# Patient Record
Sex: Female | Born: 1975 | Race: White | Hispanic: No | Marital: Married | Smoking: Never smoker
Health system: Southern US, Community
[De-identification: ages and names within clinical notes are randomized; demographics above are authoritative.]

## PROBLEM LIST (undated history)

## (undated) DIAGNOSIS — N83202 Unspecified ovarian cyst, left side: Secondary | ICD-10-CM

## (undated) DIAGNOSIS — R5383 Other fatigue: Secondary | ICD-10-CM

## (undated) DIAGNOSIS — F419 Anxiety disorder, unspecified: Secondary | ICD-10-CM

## (undated) DIAGNOSIS — Z87442 Personal history of urinary calculi: Secondary | ICD-10-CM

## (undated) DIAGNOSIS — E538 Deficiency of other specified B group vitamins: Secondary | ICD-10-CM

## (undated) DIAGNOSIS — N920 Excessive and frequent menstruation with regular cycle: Secondary | ICD-10-CM

## (undated) DIAGNOSIS — N809 Endometriosis, unspecified: Secondary | ICD-10-CM

## (undated) DIAGNOSIS — I509 Heart failure, unspecified: Secondary | ICD-10-CM

## (undated) DIAGNOSIS — N390 Urinary tract infection, site not specified: Secondary | ICD-10-CM

## (undated) DIAGNOSIS — N6019 Diffuse cystic mastopathy of unspecified breast: Secondary | ICD-10-CM

## (undated) DIAGNOSIS — F32A Depression, unspecified: Secondary | ICD-10-CM

## (undated) DIAGNOSIS — Z9889 Other specified postprocedural states: Secondary | ICD-10-CM

## (undated) DIAGNOSIS — I341 Nonrheumatic mitral (valve) prolapse: Secondary | ICD-10-CM

## (undated) DIAGNOSIS — T8859XA Other complications of anesthesia, initial encounter: Secondary | ICD-10-CM

## (undated) DIAGNOSIS — A6000 Herpesviral infection of urogenital system, unspecified: Secondary | ICD-10-CM

## (undated) HISTORY — DX: Diffuse cystic mastopathy of unspecified breast: N60.19

## (undated) HISTORY — DX: Anxiety disorder, unspecified: F41.9

## (undated) HISTORY — DX: Nonrheumatic mitral (valve) prolapse: I34.1

## (undated) HISTORY — DX: Other specified postprocedural states: Z98.890

---
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
## 1999-12-25 HISTORY — PX: MANDIBLE SURGERY: SHX707

## 2001-12-24 HISTORY — PX: TUBAL LIGATION: SHX77

## 2006-02-26 ENCOUNTER — Ambulatory Visit: Payer: Self-pay | Admitting: Unknown Physician Specialty

## 2006-09-30 ENCOUNTER — Ambulatory Visit: Payer: Self-pay | Admitting: Family Medicine

## 2007-12-25 DIAGNOSIS — G459 Transient cerebral ischemic attack, unspecified: Secondary | ICD-10-CM

## 2007-12-25 HISTORY — DX: Transient cerebral ischemic attack, unspecified: G45.9

## 2008-01-28 ENCOUNTER — Ambulatory Visit: Payer: Self-pay | Admitting: General Surgery

## 2008-08-25 ENCOUNTER — Inpatient Hospital Stay: Payer: Self-pay | Admitting: Internal Medicine

## 2008-08-27 DIAGNOSIS — Z9889 Other specified postprocedural states: Secondary | ICD-10-CM

## 2008-08-27 HISTORY — DX: Other specified postprocedural states: Z98.890

## 2008-08-27 HISTORY — PX: LEFT HEART CATH AND CORONARY ANGIOGRAPHY: CATH118249

## 2009-12-06 ENCOUNTER — Ambulatory Visit: Payer: Self-pay | Admitting: Internal Medicine

## 2010-02-10 ENCOUNTER — Ambulatory Visit: Payer: Self-pay | Admitting: Internal Medicine

## 2010-07-21 ENCOUNTER — Ambulatory Visit: Payer: Self-pay | Admitting: Internal Medicine

## 2010-12-24 HISTORY — PX: OTHER SURGICAL HISTORY: SHX169

## 2011-12-11 ENCOUNTER — Ambulatory Visit: Payer: Self-pay | Admitting: Obstetrics and Gynecology

## 2011-12-14 ENCOUNTER — Ambulatory Visit: Payer: Self-pay | Admitting: Obstetrics and Gynecology

## 2011-12-20 LAB — PATHOLOGY REPORT

## 2012-06-18 ENCOUNTER — Ambulatory Visit: Payer: Self-pay | Admitting: General Surgery

## 2013-01-16 HISTORY — PX: CARDIAC CATHETERIZATION: SHX172

## 2013-01-23 DIAGNOSIS — I341 Nonrheumatic mitral (valve) prolapse: Secondary | ICD-10-CM

## 2013-01-23 HISTORY — DX: Nonrheumatic mitral (valve) prolapse: I34.1

## 2013-06-23 DIAGNOSIS — I341 Nonrheumatic mitral (valve) prolapse: Secondary | ICD-10-CM

## 2013-06-23 HISTORY — PX: MITRAL VALVE REPAIR: SHX2039

## 2013-06-23 HISTORY — DX: Nonrheumatic mitral (valve) prolapse: I34.1

## 2013-07-09 ENCOUNTER — Ambulatory Visit: Payer: Self-pay | Admitting: General Surgery

## 2013-07-14 DIAGNOSIS — Z9889 Other specified postprocedural states: Secondary | ICD-10-CM

## 2013-07-14 HISTORY — PX: MITRAL VALVE REPAIR: SHX2039

## 2013-07-14 HISTORY — DX: Other specified postprocedural states: Z98.890

## 2013-08-20 ENCOUNTER — Encounter: Payer: Self-pay | Admitting: *Deleted

## 2013-09-07 ENCOUNTER — Ambulatory Visit: Payer: Self-pay | Admitting: General Surgery

## 2013-10-08 ENCOUNTER — Encounter: Payer: Self-pay | Admitting: *Deleted

## 2013-10-27 ENCOUNTER — Ambulatory Visit: Payer: Self-pay | Admitting: General Surgery

## 2013-11-12 ENCOUNTER — Ambulatory Visit (INDEPENDENT_AMBULATORY_CARE_PROVIDER_SITE_OTHER): Payer: BC Managed Care – PPO | Admitting: General Surgery

## 2013-11-12 ENCOUNTER — Encounter: Payer: Self-pay | Admitting: General Surgery

## 2013-11-12 VITALS — BP 120/76 | Ht 67.0 in | Wt 134.0 lb

## 2013-11-12 DIAGNOSIS — Z1239 Encounter for other screening for malignant neoplasm of breast: Secondary | ICD-10-CM

## 2013-11-12 DIAGNOSIS — N6019 Diffuse cystic mastopathy of unspecified breast: Secondary | ICD-10-CM

## 2013-11-12 NOTE — Progress Notes (Signed)
Patient ID: Virginia Rodriguez, female   DOB: 07-15-1976, 37 y.o.   MRN: [id number]  Chief Complaint  Patient presents with  . Follow-up    1 year follow up breast. No mammogram    HPI Virginia Rodriguez is a 37 y.o. female.who presents for her annual breast evaluation. The most recent mammogram was done 2013.  Patient does perform regular self breast checks and gets regular mammograms done. No new breast issues. She says the right breast still a little numb from mitral valve surgery done July 2014.    HPI  Past Medical History  Diagnosis Date  . Diffuse cystic mastopathy   . Mitral valve prolapse     Past Surgical History  Procedure Laterality Date  . Laparosopic pelvic  2012  . Tubal ligation  2003  . Mandible surgery  2001  . Mitral valve repair  July 2014    Family History  Problem Relation Age of Onset  . Breast cancer Maternal Grandmother     great grandmother    Social History History  Substance Use Topics  . Smoking status: Never Smoker   . Smokeless tobacco: Not on file  . Alcohol Use: No    Allergies  Allergen Reactions  . Macrobid [Nitrofurantoin Macrocrystal] Rash    Current Outpatient Prescriptions  Medication Sig Dispense Refill  . aspirin 81 MG tablet Take 81 mg by mouth daily.      Marland Kitchen escitalopram (LEXAPRO) 20 MG tablet Take 1 tablet by mouth daily.       No current facility-administered medications for this visit.    Review of Systems Review of Systems  Constitutional: Negative.   Respiratory: Negative.   Cardiovascular: Negative.     Blood pressure 120/76, height 5\' 7"  (1.702 m), weight 134 lb (60.782 kg), last menstrual period 10/19/2013.  Physical Exam Physical Exam  Constitutional: She is oriented to person, place, and time. She appears well-developed and well-nourished.  Eyes: Conjunctivae are normal. No scleral icterus.  Neck: Neck supple.  Cardiovascular: Normal rate, regular rhythm and normal heart sounds.   Pulmonary/Chest:  Effort normal and breath sounds normal. Right breast exhibits no inverted nipple, no mass, no nipple discharge, no skin change and no tenderness. Left breast exhibits no inverted nipple, no mass, no nipple discharge, no skin change and no tenderness.  Multiple incisions at right breast area from her recent mitral valve surgery and they are all well healed.  Lymphadenopathy:    She has no cervical adenopathy.    She has no axillary adenopathy.  Neurological: She is alert and oriented to person, place, and time.  Skin: Skin is warm and dry.    Data Reviewed none  Assessment    Stable physical exam. Prior breast cysts and lumps.    Plan    Follow up one year with a bilateral screening mammogram and office visit.        Antoino Westhoff G 11/13/2013, 1:27 PM

## 2013-11-12 NOTE — Patient Instructions (Signed)
Follow up one year with a bilateral screening mammogram and office visit Continue self breast exams. Call office for any new breast issues or concerns.

## 2013-11-13 ENCOUNTER — Encounter: Payer: Self-pay | Admitting: General Surgery

## 2014-02-17 ENCOUNTER — Ambulatory Visit: Payer: Self-pay | Admitting: Obstetrics and Gynecology

## 2014-02-17 LAB — CBC
HCT: 37.3 % (ref 35.0–47.0)
HGB: 12.5 g/dL (ref 12.0–16.0)
MCH: 29.7 pg (ref 26.0–34.0)
MCHC: 33.4 g/dL (ref 32.0–36.0)
MCV: 89 fL (ref 80–100)
PLATELETS: 149 10*3/uL — AB (ref 150–440)
RBC: 4.2 10*6/uL (ref 3.80–5.20)
RDW: 13.5 % (ref 11.5–14.5)
WBC: 4.9 10*3/uL (ref 3.6–11.0)

## 2014-02-17 LAB — BASIC METABOLIC PANEL
ANION GAP: 4 — AB (ref 7–16)
BUN: 18 mg/dL (ref 7–18)
CALCIUM: 8.5 mg/dL (ref 8.5–10.1)
Chloride: 109 mmol/L — ABNORMAL HIGH (ref 98–107)
Co2: 25 mmol/L (ref 21–32)
Creatinine: 0.76 mg/dL (ref 0.60–1.30)
EGFR (Non-African Amer.): >60
GLUCOSE: 75 mg/dL (ref 65–99)
Osmolality: 276 (ref 275–301)
Potassium: 4 mmol/L (ref 3.5–5.1)
Sodium: 138 mmol/L (ref 136–145)

## 2014-02-21 HISTORY — PX: ABDOMINAL HYSTERECTOMY: SHX81

## 2014-02-26 ENCOUNTER — Ambulatory Visit: Payer: Self-pay | Admitting: Obstetrics and Gynecology

## 2014-02-27 LAB — BASIC METABOLIC PANEL
ANION GAP: 11 (ref 7–16)
BUN: 10 mg/dL (ref 7–18)
CO2: 28 mmol/L (ref 21–32)
Calcium, Total: 8.5 mg/dL (ref 8.5–10.1)
Chloride: 108 mmol/L — ABNORMAL HIGH (ref 98–107)
Creatinine: 0.8 mg/dL (ref 0.60–1.30)
EGFR (African American): >60
EGFR (Non-African Amer.): >60
Glucose: 102 mg/dL — ABNORMAL HIGH (ref 65–99)
Osmolality: 292 (ref 275–301)
Potassium: 3.9 mmol/L (ref 3.5–5.1)
SODIUM: 147 mmol/L — AB (ref 136–145)

## 2014-02-27 LAB — HEMATOCRIT: HCT: 34.3 % — AB (ref 35.0–47.0)

## 2014-03-02 LAB — PATHOLOGY REPORT

## 2014-09-20 ENCOUNTER — Encounter: Payer: Self-pay | Admitting: General Surgery

## 2014-10-25 ENCOUNTER — Encounter: Payer: Self-pay | Admitting: General Surgery

## 2014-11-02 ENCOUNTER — Encounter: Payer: Self-pay | Admitting: General Surgery

## 2014-11-03 ENCOUNTER — Ambulatory Visit (INDEPENDENT_AMBULATORY_CARE_PROVIDER_SITE_OTHER): Payer: BC Managed Care – PPO | Admitting: General Surgery

## 2014-11-03 ENCOUNTER — Encounter: Payer: Self-pay | Admitting: General Surgery

## 2014-11-03 VITALS — BP 98/62 | HR 78 | Resp 12 | Ht 67.0 in | Wt 136.0 lb

## 2014-11-03 DIAGNOSIS — N6019 Diffuse cystic mastopathy of unspecified breast: Secondary | ICD-10-CM

## 2014-11-03 NOTE — Progress Notes (Signed)
Patient ID: Virginia Rodriguez, female   DOB: 10/03/1976, 38 y.o.   MRN: [id number]  Chief Complaint  Patient presents with  . Follow-up    mammogram    HPI Virginia Rodriguez is a 38 y.o. female who presents for a breast evaluation. The most recent mammogram was done on 10/27/14.Marland Kitchen  Patient does perform regular self breast checks and gets regular mammograms done.    HPI  Past Medical History  Diagnosis Date  . Diffuse cystic mastopathy   . Mitral valve prolapse 06/2013    Past Surgical History  Procedure Laterality Date  . Laparosopic pelvic  2012  . Tubal ligation  2003  . Mandible surgery  2001  . Mitral valve repair  July 2014  . Abdominal hysterectomy  02/2014    Family History  Problem Relation Age of Onset  . Breast cancer Maternal Grandmother     great grandmother    Social History History  Substance Use Topics  . Smoking status: Never Smoker   . Smokeless tobacco: Not on file  . Alcohol Use: No    Allergies  Allergen Reactions  . Macrobid [Nitrofurantoin Macrocrystal] Rash    Current Outpatient Prescriptions  Medication Sig Dispense Refill  . aspirin 81 MG tablet Take 81 mg by mouth daily.    Marland Kitchen FLUoxetine (PROZAC) 40 MG capsule Take 1 capsule by mouth daily.  10   No current facility-administered medications for this visit.    Review of Systems Review of Systems  Constitutional: Negative.   Respiratory: Negative.   Cardiovascular: Negative.     Blood pressure 98/62, pulse 78, resp. rate 12, height 5\' 7"  (1.702 m), weight 136 lb (61.689 kg), last menstrual period 10/19/2013.  Physical Exam Physical Exam  Constitutional: She is oriented to person, place, and time. She appears well-developed and well-nourished.  Eyes: Conjunctivae are normal. No scleral icterus.  Neck: Neck supple.  Cardiovascular: Normal rate, regular rhythm and normal heart sounds.   Pulmonary/Chest: Effort normal and breath sounds normal. Right breast exhibits no inverted nipple,  no mass, no nipple discharge, no skin change and no tenderness. Left breast exhibits no inverted nipple, no mass, no nipple discharge, no skin change and no tenderness.  Abdominal: Soft. Bowel sounds are normal.  Lymphadenopathy:    She has no cervical adenopathy.    She has no axillary adenopathy.  Neurological: She is alert and oriented to person, place, and time.  Skin: Skin is warm and dry.       Data Reviewed Mammogram reviewed and stable  Assessment    Stable exam. History of breast cysts in past     Plan    Patient will be asked to return to the office in one yearfor a breast exam. Mammogram to start in 2 yrs.        Edith Groleau G 11/04/2014, 6:17 AM

## 2014-11-03 NOTE — Patient Instructions (Addendum)
The patient has been asked to return to the office in one year .Marland Kitchen Continue self breast exams. Call office for any new breast issues or concerns.

## 2014-11-04 ENCOUNTER — Ambulatory Visit: Payer: BC Managed Care – PPO | Admitting: General Surgery

## 2014-11-04 ENCOUNTER — Encounter: Payer: Self-pay | Admitting: General Surgery

## 2015-04-16 NOTE — Op Note (Signed)
PATIENT NAME:  Virginia Rodriguez, Virginia Rodriguez MR#:  [id number] DATE OF BIRTH:  Nov 22, 1976  DATE OF PROCEDURE:  02/26/2014  PREOPERATIVE DIAGNOSES: 1.  Chronic pelvic pain.  2.  Menorrhagia.   POSTOPERATIVE DIAGNOSES: 1.  Chronic pelvic pain. 2.  Endometriosis.  3.  Menorrhagia.   PROCEDURES:  1.  Laparoscopic assisted vaginal hysterectomy. 2.  Right salpingo-oophorectomy. 3.  Left salpingectomy. 4.  Peritoneal biopsy.   SURGEON: Boykin Nearing, MD  ANESTHESIA: General endotracheal.   INDICATIONS: This is a 39 year old gravida 2, para 2, a patient with chronic pelvic pain for many years. The patient has undergone 2 laparoscopic evaluations. The patient was deemed to have presumed endometriosis by Dr. Amalia Hailey. The patient has long history of menorrhagia and clot passage.   DESCRIPTION OF PROCEDURE: After adequate general endotracheal anesthesia, the patient was placed in the dorsal supine position. Legs were placed in the [place]. The patient was given intravenous ampicillin and gentamicin for her mitral valve repair and for prophylaxis for the procedure. The patient's abdomen was prepped and draped in normal sterile fashion. Foley catheter was placed in the bladder. The cervix was grasped with a single-tooth tenaculum and the uterus was sounded with a uterine sound and of these 2 instruments were taped together to be used for uterine manipulation during the procedure. Gloves were changed. A 15 mm infraumbilical incision was made after injecting with 0.5% Marcaine. The laparoscope was advanced into the abdominal cavity under direct visualization with use of the Optiview cannula. The patient's abdomen was insufflated with carbon dioxide. The patient was placed in Trendelenburg. A second port site was placed in the left lower quadrant 3 cm medial to the left anterior iliac spine, and an 11 mm port was advanced into the abdominal cavity under direct visualization. A third port site was placed on  the right lower quadrant, again 3 cm medial to the right anterior iliac spine, and a 5 mm port was advanced. Attention was directed to the patient's right ovary, which was free. There was not significant scar tissue. In the posterior cul-de-sac, there were several Allen-Masters windows consistent with endometriosis. The right fallopian tube and ovary were grasped with the atraumatic graspers and placed on traction medially while the infundibulopelvic ligament was cauterized and transected with the Harmonic scalpel. The broad ligament was incised followed by opening the round ligament with the Harmonic scalpel. The uterus was grasped at the right cornu aspect and placed on traction to the left while uterine artery was identified and cauterized with the Kleppingers. Bladder flap was opened with the Harmonic and the right uterine artery was then transected with the Harmonic scalpel. Attention was then directed to the patient's left fallopian tube which was grasped at the fimbriated portion, and the Harmonic was used to dissect the distal portion of the fallopian tube on the left and this was removed through the left lower port site. The round ligament was then clamped and transected with the Harmonic scalpel. The broad ligament was opened and the utero-ovarian ligament was clamped and transected with the Harmonic scalpel. The left uterine artery was skeletonized and cauterized with the Kleppingers and transected with the Harmonic scalpel. The central posterior cul-de-sac Allen-Masters window was also grasped at the midportion and a portion of the peritoneum was dissected free. This will be sent to pathology for identification for presumed endometriosis. Good hemostasis was noted. Procedure was then converted to the vaginal approach. The legs were brought up. Weighted speculum was placed in the posterior vaginal  vault, and the anterior cervix was then grasped with a thyroid tenaculum after removal of the single-tooth  tenaculum and the uterine sound. The cervix was circumferentially injected with 1% lidocaine with epinephrine. A direct posterior colpotomy incision was made. Upon entry into the posterior cul-de-sac, the long bill weighted speculum was placed. Uterosacral ligaments were bilaterally clamped, transected, and suture ligated with 0 Vicryl suture and were tagged for later identification. The anterior cervix was circumferentially incised with the Bovie and the anterior cul-de-sac was entered without difficulty. A Deaver retractor was placed within to elevate the bladder anteriorly. The cardinal ligaments were bilaterally clamped, transected, and suture ligated with 0 Vicryl suture, and 1 additional clamp on the broad ligaments bilaterally allowed for fraying of the uterus and the right adnexa to come through the vaginal canal. Those pedicles were bilaterally sutured with 0 Vicryl suture. Good hemostasis was noted. The peritoneum was then closed with a 2-0 PDS suture in a pursestring fashion, and the vaginal vault was closed with a running 0 Vicryl suture. The uterosacral ligaments were plicated centrally, and the rest of the vaginal vault was closed with the 0 Vicryl suture. Good hemostasis was noted. A repeat laparoscopy demonstrated good ureteral peristalsis bilaterally. This was identified throughout the intra-abdominal portion of the laparoscopy. Good hemostasis was noted. Pressure was brought down to 6 mmHg and again good hemostasis was noted. The patient's abdomen was deflated. All instruments were removed. The infraumbilical and the left lower port site were closed with a deep fascial layer of 2-0 Vicryl suture and all port site skin was closed with interrupted 4-0 Vicryl suture, Steri-Strips applied, and a sterile dressing applied. There were no complications. Estimated blood loss 50 mL. Intraoperative fluids 1400 mL. Urine output 200 mL. The patient tolerated the procedure well and was taken to the recovery room  in good condition. ____________________________ Boykin Nearing, MD tjs:sb D: 02/26/2014 12:51:46 ET T: 02/26/2014 13:35:23 ET JOB#: [id number]  cc: Boykin Nearing, MD, <Dictator> Boykin Nearing MD ELECTRONICALLY SIGNED 03/01/2014 7:52

## 2015-11-08 ENCOUNTER — Ambulatory Visit: Payer: Self-pay | Admitting: General Surgery

## 2016-01-03 ENCOUNTER — Encounter: Payer: Self-pay | Admitting: *Deleted

## 2016-01-17 ENCOUNTER — Ambulatory Visit: Payer: Self-pay | Admitting: General Surgery

## 2016-09-03 ENCOUNTER — Encounter: Payer: Self-pay | Admitting: General Surgery

## 2016-09-05 ENCOUNTER — Encounter: Payer: Self-pay | Admitting: General Surgery

## 2016-09-10 ENCOUNTER — Other Ambulatory Visit: Payer: Self-pay | Admitting: Physician Assistant

## 2016-09-10 ENCOUNTER — Encounter: Payer: Self-pay | Admitting: *Deleted

## 2016-09-10 DIAGNOSIS — R1011 Right upper quadrant pain: Secondary | ICD-10-CM

## 2016-09-10 DIAGNOSIS — R112 Nausea with vomiting, unspecified: Secondary | ICD-10-CM

## 2016-09-11 ENCOUNTER — Ambulatory Visit
Admission: RE | Admit: 2016-09-11 | Discharge: 2016-09-11 | Disposition: A | Discharge status: Home / Self Care | Payer: Managed Care, Other (non HMO) | Source: Ambulatory Visit | Attending: Physician Assistant | Admitting: Physician Assistant

## 2016-09-11 DIAGNOSIS — R112 Nausea with vomiting, unspecified: Secondary | ICD-10-CM | POA: Diagnosis not present

## 2016-09-11 DIAGNOSIS — R1011 Right upper quadrant pain: Secondary | ICD-10-CM | POA: Insufficient documentation

## 2016-09-12 ENCOUNTER — Other Ambulatory Visit: Payer: Self-pay | Admitting: Physician Assistant

## 2016-09-12 DIAGNOSIS — R1011 Right upper quadrant pain: Secondary | ICD-10-CM

## 2016-09-12 DIAGNOSIS — R110 Nausea: Secondary | ICD-10-CM

## 2016-09-17 ENCOUNTER — Encounter: Payer: Self-pay | Admitting: General Surgery

## 2016-09-17 ENCOUNTER — Ambulatory Visit (INDEPENDENT_AMBULATORY_CARE_PROVIDER_SITE_OTHER): Payer: Managed Care, Other (non HMO) | Admitting: General Surgery

## 2016-09-17 VITALS — BP 118/78 | HR 80 | Resp 12 | Ht 67.0 in | Wt 134.0 lb

## 2016-09-17 DIAGNOSIS — G8929 Other chronic pain: Secondary | ICD-10-CM | POA: Diagnosis not present

## 2016-09-17 DIAGNOSIS — R1010 Upper abdominal pain, unspecified: Secondary | ICD-10-CM | POA: Diagnosis not present

## 2016-09-17 DIAGNOSIS — N6019 Diffuse cystic mastopathy of unspecified breast: Secondary | ICD-10-CM

## 2016-09-17 DIAGNOSIS — R1011 Right upper quadrant pain: Secondary | ICD-10-CM

## 2016-09-17 NOTE — Patient Instructions (Signed)
Return after HIDA scan

## 2016-09-17 NOTE — Progress Notes (Signed)
Patient ID: Virginia Rodriguez, female   DOB: [birth year], 40 y.o.   MRN: [id number]  Chief Complaint  Patient presents with  . Follow-up    mammogram    HPI Virginia Rodriguez is a 40 y.o. female who presents for a breast evaluation. The most recent mammogram was done on 09/03/16.  Patient does perform regular self breast checks and gets regular mammograms done.    Pt reports RUQ abdominal pain for the past month, she finally saw her PCP about this last week due to start of vomiting. An ultrasound was completed which showed no abnormalities of the gallbladder. Pt continues to state pain is worsening and now radiating to her back. HIDA scan scheduled.  I have reviewed the history of present illness with the patient.  HPI  Past Medical History:  Diagnosis Date  . Diffuse cystic mastopathy   . Mitral valve prolapse 06/2013    Past Surgical History:  Procedure Laterality Date  . ABDOMINAL HYSTERECTOMY  02/2014  . laparosopic pelvic  2012  . MANDIBLE SURGERY  2001  . MITRAL VALVE REPAIR  July 2014  . TUBAL LIGATION  2003    Family History  Problem Relation Age of Onset  . Breast cancer Maternal Grandmother     great grandmother    Social History Social History  Substance Use Topics  . Smoking status: Never Smoker  . Smokeless tobacco: Not on file  . Alcohol use No    Allergies  Allergen Reactions  . Banana Anaphylaxis  . Macrobid [Nitrofurantoin Macrocrystal] Rash    Current Outpatient Prescriptions  Medication Sig Dispense Refill  . aspirin 81 MG tablet Take 81 mg by mouth daily.    Virginia Rodriguez escitalopram (LEXAPRO) 20 MG tablet TAKE 1 TABLET (20 MG TOTAL) BY MOUTH ONCE DAILY.     No current facility-administered medications for this visit.     Review of Systems Review of Systems  Constitutional: Negative.   Respiratory: Negative.   Cardiovascular: Negative.   Gastrointestinal: Positive for abdominal pain.    Blood pressure 118/78, pulse 80, resp. rate 12, height 5\' 7"   (1.702 m), weight 134 lb (60.8 kg), last menstrual period 10/19/2013.  Physical Exam Physical Exam  Constitutional: She is oriented to person, place, and time. She appears well-developed and well-nourished.  Eyes: Conjunctivae are normal. No scleral icterus.  Neck: Neck supple.  Cardiovascular: Normal rate, regular rhythm and normal heart sounds.   Pulmonary/Chest: Effort normal and breath sounds normal. Right breast exhibits no inverted nipple, no mass, no nipple discharge, no skin change and no tenderness. Left breast exhibits no inverted nipple, no mass, no nipple discharge, no skin change and no tenderness.  Abdominal: Soft. Normal appearance and bowel sounds are normal. There is no hepatomegaly. There is tenderness in the right upper quadrant.  Lymphadenopathy:    She has no cervical adenopathy.    She has no axillary adenopathy.  Neurological: She is alert and oriented to person, place, and time.  Skin: Skin is warm and dry.       Data Reviewed Mammogram reviewed, ultrasound reviewed  Assessment    Stable breast exam. Additional complaint of RUQ abdominal pain.    Plan    Return after HIDA scan 09/18/16.   Follow up in 1 year with bilateral screening mammogram. Call for any new breast issues or concerns.  This information has been scribed by Gaspar Cola CMA.        Alanea Woolridge G 09/18/2016, 12:01 PM

## 2016-09-18 ENCOUNTER — Ambulatory Visit
Admission: RE | Admit: 2016-09-18 | Discharge: 2016-09-18 | Disposition: A | Discharge status: Home / Self Care | Payer: Managed Care, Other (non HMO) | Source: Ambulatory Visit | Attending: Physician Assistant | Admitting: Physician Assistant

## 2016-09-18 ENCOUNTER — Encounter: Payer: Self-pay | Admitting: General Surgery

## 2016-09-18 DIAGNOSIS — R110 Nausea: Secondary | ICD-10-CM | POA: Insufficient documentation

## 2016-09-18 DIAGNOSIS — R1011 Right upper quadrant pain: Secondary | ICD-10-CM | POA: Diagnosis not present

## 2016-09-18 MED ORDER — TECHNETIUM TC 99M MEBROFENIN IV KIT
5.0100 | PACK | Freq: Once | INTRAVENOUS | Status: AC | PRN
  Administered 2016-09-18: 5.01 via INTRAVENOUS

## 2016-09-19 ENCOUNTER — Ambulatory Visit: Payer: Self-pay | Admitting: General Surgery

## 2016-09-19 ENCOUNTER — Telehealth: Payer: Self-pay | Admitting: *Deleted

## 2016-09-19 NOTE — Telephone Encounter (Signed)
Patient called looking for her results on the Hida scan. She also wanted to let you know that she got a e-mail saying her insurance ends this Saturday 09/22/16

## 2016-09-28 ENCOUNTER — Ambulatory Visit: Admission: RE | Admit: 2016-09-28 | Payer: Managed Care, Other (non HMO) | Source: Ambulatory Visit

## 2016-10-01 ENCOUNTER — Ambulatory Visit: Payer: Managed Care, Other (non HMO) | Admitting: General Surgery

## 2017-02-07 ENCOUNTER — Encounter: Payer: Self-pay | Admitting: Obstetrics and Gynecology

## 2017-02-27 ENCOUNTER — Encounter: Payer: Self-pay | Admitting: Obstetrics and Gynecology

## 2017-02-27 ENCOUNTER — Ambulatory Visit (INDEPENDENT_AMBULATORY_CARE_PROVIDER_SITE_OTHER): Payer: Managed Care, Other (non HMO) | Admitting: Obstetrics and Gynecology

## 2017-02-27 VITALS — BP 112/67 | HR 73 | Ht 68.0 in | Wt 139.1 lb

## 2017-02-27 DIAGNOSIS — N3941 Urge incontinence: Secondary | ICD-10-CM

## 2017-02-27 DIAGNOSIS — Z01419 Encounter for gynecological examination (general) (routine) without abnormal findings: Secondary | ICD-10-CM | POA: Diagnosis not present

## 2017-02-27 NOTE — Progress Notes (Signed)
GYNECOLOGY ANNUAL PHYSICAL EXAM PROGRESS NOTE  Subjective:    Virginia Rodriguez is a 41 y.o. G46P2002 female who presents to establish care, and for an annual exam. The patient has no complaints today. The patient is sexually active. The patient wears seatbelts: yes. The patient participates in regular exercise: yes. Has the patient ever been transfused or tattooed?: no. The patient reports that there is not domestic violence in her life.    Gynecologic History  Menarche age: 47 Patient's last menstrual period was 10/19/2013. Contraception: status post hysterectomy History of STI's: Denies  Last Pap: 2 years ago. Results were: normal.  Denies h/o abnormal pap smears. Last mammogram: 09/2016. Results were: abnormal (BIRADS 0), with f/u right breast  (normal).  Has h/o cystic mastopathy.      Obstetric History   G2   P2   T2   P0   A0   L2    SAB0   TAB0   Ectopic0   Multiple0   Live Births2     # Outcome Date GA Lbr Len/2nd Weight Sex Delivery Anes PTL Lv  2 Term     M    LIV  1 Term     F    LIV    Obstetric Comments  1st Menstrual Cycle:  12  1st Pregnancy:  23    Past Medical History:  Diagnosis Date  . Anxiety   . Diffuse cystic mastopathy   . Mitral valve prolapse 06/2013    Past Surgical History:  Procedure Laterality Date  . ABDOMINAL HYSTERECTOMY  02/2014  . laparosopic pelvic  2012   with right oophorectomy  . MANDIBLE SURGERY  2001  . MITRAL VALVE REPAIR  July 2014  . TUBAL LIGATION  2003    Family History  Problem Relation Age of Onset  . Heart disease Mother   . Diabetes Father   . Heart disease Father   . Breast cancer Maternal Grandmother     great grandmother    Social History   Social History  . Marital status: Married    Spouse name: N/A  . Number of children: N/A  . Years of education: N/A   Occupational History  . Not on file.   Social History Main Topics  . Smoking status: Never Smoker  . Smokeless tobacco: Never Used  .  Alcohol use No  . Drug use: No  . Sexual activity: Yes    Birth control/ protection: Surgical   Other Topics Concern  . Not on file   Social History Narrative  . No narrative on file    Current Outpatient Prescriptions on File Prior to Visit  Medication Sig Dispense Refill  . aspirin 81 MG tablet Take 81 mg by mouth daily.    Marland Kitchen escitalopram (LEXAPRO) 20 MG tablet TAKE 1 TABLET (20 MG TOTAL) BY MOUTH ONCE DAILY.     No current facility-administered medications on file prior to visit.     Allergies  Allergen Reactions  . Banana Anaphylaxis  . Macrobid [Nitrofurantoin Macrocrystal] Rash      Review of Systems Constitutional: negative for chills, fatigue, fevers and sweats Eyes: negative for irritation, redness and visual disturbance Ears, nose, mouth, throat, and face: negative for hearing loss, nasal congestion, snoring and tinnitus Respiratory: negative for asthma, cough, sputum Cardiovascular: negative for chest pain, dyspnea, exertional chest pressure/discomfort, irregular heart beat, palpitations and syncope Gastrointestinal: negative for abdominal pain, change in bowel habits, nausea and vomiting Genitourinary: Positive for urinary  urgency with leakage (ongoing for ~ 1-2 years).  Negative for abnormal menstrual periods, genital lesions, sexual problems and vaginal discharge, dysuri.  Integument/breast: negative for breast lump, breast tenderness and nipple discharge Hematologic/lymphatic: negative for bleeding and easy bruising Musculoskeletal:negative for back pain and muscle weakness Neurological: negative for dizziness, headaches, vertigo and weakness Endocrine: negative for diabetic symptoms including polydipsia, polyuria and skin dryness Allergic/Immunologic: negative for hay fever and urticaria        Objective:  Blood pressure 112/67, pulse 73, height 5\' 8"  (1.727 m), weight 139 lb 1.6 oz (63.1 kg), last menstrual period 10/19/2013. Body mass index is 21.15  kg/m.  General Appearance:    Alert, cooperative, no distress, appears stated age  Head:    Normocephalic, without obvious abnormality, atraumatic  Eyes:    PERRL, conjunctiva/corneas clear, EOM's intact, both eyes  Ears:    Normal external ear canals, both ears  Nose:   Nares normal, septum midline, mucosa normal, no drainage or sinus tenderness  Throat:   Lips, mucosa, and tongue normal; teeth and gums normal  Neck:   Supple, symmetrical, trachea midline, no adenopathy; thyroid: no enlargement/tenderness/nodules; no carotid bruit or JVD  Back:     Symmetric, no curvature, ROM normal, no CVA tenderness  Lungs:     Clear to auscultation bilaterally, respirations unlabored  Chest Wall:    No tenderness or deformity   Heart:    Regular rate and rhythm, S1 and S2 normal, no murmur, rub or gallop  Breast Exam:    No tenderness, masses, or nipple abnormality  Abdomen:     Soft, non-tender, bowel sounds active all four quadrants, no masses, no organomegaly.    Genitalia:    Pelvic:external genitalia normal, vagina without lesions, discharge, or tenderness, rectovaginal septum  normal.  Cystocele Grade 1-2 present. No leakage of urine on Valsalva. Uterus and cervix surgically absent.  Right ovary surgically absent.  No adnexal masses or tenderness on left.    Rectal:    Normal external sphincter.  No hemorrhoids appreciated. Internal exam not done.   Extremities:   Extremities normal, atraumatic, no cyanosis or edema  Pulses:   2+ and symmetric all extremities  Skin:   Skin color, texture, turgor normal, no rashes or lesions  Lymph nodes:   Cervical, supraclavicular, and axillary nodes normal  Neurologic:   CNII-XII intact, normal strength, sensation and reflexes throughout   .  Labs:  Labs reviewed in [facility] [place], 08/31/2016).   Assessment:    Healthy female exam.   Urinary urgency with mild incontinence  Plan:     Blood tests: None.  Labs reviewed in [place]. Breast self exam technique reviewed and patient encouraged to perform self-exam monthly. Contraception: status post hysterectomy. Discussed healthy lifestyle modifications. Mammogram up to date.  Reports theses are usually ordered by Dr. Jamal Collin (General Surgery) due to h/o cystic mastopathy.  Pap smear no longer indicated. Patient is s/p hysterectomy with no prior ho CIN II-III.   Discussed urinary urgency with mild incontinence.  Advised on avoidance of bladder irritants (caffiene, citrus fruits/drinks, spicy foods), timed voiding, and Kegel exercises.  To return if symptoms worsen or become more bothersome to patient to discuss other options.  Follow up in 1 year.    Rubie Maid, MD [facility]

## 2017-02-27 NOTE — Patient Instructions (Addendum)
Health Maintenance, Female Adopting a healthy lifestyle and getting preventive care can go a long way to promote health and wellness. Talk with your health care provider about what schedule of regular examinations is right for you. This is a good chance for you to check in with your provider about disease prevention and staying healthy. In between checkups, there are plenty of things you can do on your own. Experts have done a lot of research about which lifestyle changes and preventive measures are most likely to keep you healthy. Ask your health care provider for more information. Weight and diet Eat a healthy diet  Be sure to include plenty of vegetables, fruits, low-fat dairy products, and lean protein.  Do not eat a lot of foods high in solid fats, added sugars, or salt.  Get regular exercise. This is one of the most important things you can do for your health.  Most adults should exercise for at least 150 minutes each week. The exercise should increase your heart rate and make you sweat (moderate-intensity exercise).  Most adults should also do strengthening exercises at least twice a week. This is in addition to the moderate-intensity exercise. Maintain a healthy weight  Body mass index (BMI) is a measurement that can be used to identify possible weight problems. It estimates body fat based on height and weight. Your health care provider can help determine your BMI and help you achieve or maintain a healthy weight.  For females 76 years of age and older:  A BMI below 18.5 is considered underweight.  A BMI of 18.5 to 24.9 is normal.  A BMI of 25 to 29.9 is considered overweight.  A BMI of 30 and above is considered obese. Watch levels of cholesterol and blood lipids  You should start having your blood tested for lipids and cholesterol at 41 years of age, then have this test every 5 years.  You may need to have your cholesterol levels checked more often if:  Your lipid or  cholesterol levels are high.  You are older than 41 years of age.  You are at high risk for heart disease. Cancer screening Lung Cancer  Lung cancer screening is recommended for adults 64-42 years old who are at high risk for lung cancer because of a history of smoking.  A yearly low-dose CT scan of the lungs is recommended for people who:  Currently smoke.  Have quit within the past 15 years.  Have at least a 30-pack-year history of smoking. A pack year is smoking an average of one pack of cigarettes a day for 1 year.  Yearly screening should continue until it has been 15 years since you quit.  Yearly screening should stop if you develop a health problem that would prevent you from having lung cancer treatment. Breast Cancer  Practice breast self-awareness. This means understanding how your breasts normally appear and feel.  It also means doing regular breast self-exams. Let your health care provider know about any changes, no matter how small.  If you are in your 20s or 30s, you should have a clinical breast exam (CBE) by a health care provider every 1-3 years as part of a regular health exam.  If you are 34 or older, have a CBE every year. Also consider having a breast X-ray (mammogram) every year.  If you have a family history of breast cancer, talk to your health care provider about genetic screening.  If you are at high risk for breast cancer, talk  to your health care provider about having an MRI and a mammogram every year.  Breast cancer gene (BRCA) assessment is recommended for women who have family members with BRCA-related cancers. BRCA-related cancers include:  Breast.  Ovarian.  Tubal.  Peritoneal cancers.  Results of the assessment will determine the need for genetic counseling and BRCA1 and BRCA2 testing. Cervical Cancer  Your health care provider may recommend that you be screened regularly for cancer of the pelvic organs (ovaries, uterus, and vagina).  This screening involves a pelvic examination, including checking for microscopic changes to the surface of your cervix (Pap test). You may be encouraged to have this screening done every 3 years, beginning at age 24.  For women ages 66-65, health care providers may recommend pelvic exams and Pap testing every 3 years, or they may recommend the Pap and pelvic exam, combined with testing for human papilloma virus (HPV), every 5 years. Some types of HPV increase your risk of cervical cancer. Testing for HPV may also be done on women of any age with unclear Pap test results.  Other health care providers may not recommend any screening for nonpregnant women who are considered low risk for pelvic cancer and who do not have symptoms. Ask your health care provider if a screening pelvic exam is right for you.  If you have had past treatment for cervical cancer or a condition that could lead to cancer, you need Pap tests and screening for cancer for at least 20 years after your treatment. If Pap tests have been discontinued, your risk factors (such as having a new sexual partner) need to be reassessed to determine if screening should resume. Some women have medical problems that increase the chance of getting cervical cancer. In these cases, your health care provider may recommend more frequent screening and Pap tests. Colorectal Cancer  This type of cancer can be detected and often prevented.  Routine colorectal cancer screening usually begins at 41 years of age and continues through 41 years of age.  Your health care provider may recommend screening at an earlier age if you have risk factors for colon cancer.  Your health care provider may also recommend using home test kits to check for hidden blood in the stool.  A small camera at the end of a tube can be used to examine your colon directly (sigmoidoscopy or colonoscopy). This is done to check for the earliest forms of colorectal cancer.  Routine  screening usually begins at age 41.  Direct examination of the colon should be repeated every 5-10 years through 41 years of age. However, you may need to be screened more often if early forms of precancerous polyps or small growths are found. Skin Cancer  Check your skin from head to toe regularly.  Tell your health care provider about any new moles or changes in moles, especially if there is a change in a mole's shape or color.  Also tell your health care provider if you have a mole that is larger than the size of a pencil eraser.  Always use sunscreen. Apply sunscreen liberally and repeatedly throughout the day.  Protect yourself by wearing long sleeves, pants, a wide-brimmed hat, and sunglasses whenever you are outside. Heart disease, diabetes, and high blood pressure  High blood pressure causes heart disease and increases the risk of stroke. High blood pressure is more likely to develop in:  People who have blood pressure in the high end of the normal range (130-139/85-89 mm Hg).  People who are overweight or obese.  People who are African American.  If you are 59-24 years of age, have your blood pressure checked every 3-5 years. If you are 34 years of age or older, have your blood pressure checked every year. You should have your blood pressure measured twice-once when you are at a hospital or clinic, and once when you are not at a hospital or clinic. Record the average of the two measurements. To check your blood pressure when you are not at a hospital or clinic, you can use:  An automated blood pressure machine at a pharmacy.  A home blood pressure monitor.  If you are between 29 years and 60 years old, ask your health care provider if you should take aspirin to prevent strokes.  Have regular diabetes screenings. This involves taking a blood sample to check your fasting blood sugar level.  If you are at a normal weight and have a low risk for diabetes, have this test once  every three years after 41 years of age.  If you are overweight and have a high risk for diabetes, consider being tested at a younger age or more often. Preventing infection Hepatitis B  If you have a higher risk for hepatitis B, you should be screened for this virus. You are considered at high risk for hepatitis B if:  You were born in a country where hepatitis B is common. Ask your health care provider which countries are considered high risk.  Your parents were born in a high-risk country, and you have not been immunized against hepatitis B (hepatitis B vaccine).  You have HIV or AIDS.  You use needles to inject street drugs.  You live with someone who has hepatitis B.  You have had sex with someone who has hepatitis B.  You get hemodialysis treatment.  You take certain medicines for conditions, including cancer, organ transplantation, and autoimmune conditions. Hepatitis C  Blood testing is recommended for:  Everyone born from [birth year] through 1965.  Anyone with known risk factors for hepatitis C. Sexually transmitted infections (STIs)  You should be screened for sexually transmitted infections (STIs) including gonorrhea and chlamydia if:  You are sexually active and are younger than 41 years of age.  You are older than 41 years of age and your health care provider tells you that you are at risk for this type of infection.  Your sexual activity has changed since you were last screened and you are at an increased risk for chlamydia or gonorrhea. Ask your health care provider if you are at risk.  If you do not have HIV, but are at risk, it may be recommended that you take a prescription medicine daily to prevent HIV infection. This is called pre-exposure prophylaxis (PrEP). You are considered at risk if:  You are sexually active and do not regularly use condoms or know the HIV status of your partner(s).  You take drugs by injection.  You are sexually active with a partner  who has HIV. Talk with your health care provider about whether you are at high risk of being infected with HIV. If you choose to begin PrEP, you should first be tested for HIV. You should then be tested every 3 months for as long as you are taking PrEP. Pregnancy  If you are premenopausal and you may become pregnant, ask your health care provider about preconception counseling.  If you may become pregnant, take 400 to 800 micrograms (mcg) of folic acid  every day.  If you want to prevent pregnancy, talk to your health care provider about birth control (contraception). Osteoporosis and menopause  Osteoporosis is a disease in which the bones lose minerals and strength with aging. This can result in serious bone fractures. Your risk for osteoporosis can be identified using a bone density scan.  If you are 4 years of age or older, or if you are at risk for osteoporosis and fractures, ask your health care provider if you should be screened.  Ask your health care provider whether you should take a calcium or vitamin D supplement to lower your risk for osteoporosis.  Menopause may have certain physical symptoms and risks.  Hormone replacement therapy may reduce some of these symptoms and risks. Talk to your health care provider about whether hormone replacement therapy is right for you. Follow these instructions at home:  Schedule regular health, dental, and eye exams.  Stay current with your immunizations.  Do not use any tobacco products including cigarettes, chewing tobacco, or electronic cigarettes.  If you are pregnant, do not drink alcohol.  If you are breastfeeding, limit how much and how often you drink alcohol.  Limit alcohol intake to no more than 1 drink per day for nonpregnant women. One drink equals 12 ounces of beer, 5 ounces of wine, or 1 ounces of hard liquor.  Do not use street drugs.  Do not share needles.  Ask your health care provider for help if you need support  or information about quitting drugs.  Tell your health care provider if you often feel depressed.  Tell your health care provider if you have ever been abused or do not feel safe at home. This information is not intended to replace advice given to you by your health care provider. Make sure you discuss any questions you have with your health care provider. Document Released: 06/25/2011 Document Revised: 05/17/2016 Document Reviewed: 09/13/2015 Elsevier Interactive Patient Education  2017 [employer].

## 2017-02-28 ENCOUNTER — Encounter: Payer: Self-pay | Admitting: *Deleted

## 2017-04-09 ENCOUNTER — Other Ambulatory Visit: Payer: Self-pay | Admitting: *Deleted

## 2017-04-09 MED ORDER — VALACYCLOVIR HCL 1 G PO TABS
1000.0000 mg | ORAL_TABLET | Freq: Two times a day (BID) | ORAL | 6 refills | Status: DC
Start: 2017-04-09 — End: 2022-07-01

## 2017-05-08 ENCOUNTER — Other Ambulatory Visit: Payer: Self-pay | Admitting: *Deleted

## 2017-05-08 MED ORDER — ADAPALENE-BENZOYL PEROXIDE 0.1-2.5 % EX GEL: 1.0000 | Freq: Every day | CUTANEOUS | 6 refills | Status: DC

## 2017-07-18 ENCOUNTER — Other Ambulatory Visit: Payer: Self-pay | Admitting: *Deleted

## 2017-07-18 MED ORDER — ESCITALOPRAM OXALATE 20 MG PO TABS: ORAL_TABLET | ORAL | 6 refills | Status: DC

## 2017-07-22 ENCOUNTER — Other Ambulatory Visit: Payer: Self-pay

## 2017-07-22 DIAGNOSIS — Z1231 Encounter for screening mammogram for malignant neoplasm of breast: Secondary | ICD-10-CM

## 2017-07-26 ENCOUNTER — Other Ambulatory Visit: Payer: Self-pay | Admitting: *Deleted

## 2017-07-26 ENCOUNTER — Inpatient Hospital Stay
Admission: RE | Admit: 2017-07-26 | Discharge: 2017-07-26 | Disposition: A | Discharge status: Home / Self Care | Payer: Self-pay | Source: Ambulatory Visit | Attending: *Deleted | Admitting: *Deleted

## 2017-07-26 DIAGNOSIS — Z9289 Personal history of other medical treatment: Secondary | ICD-10-CM

## 2017-09-04 ENCOUNTER — Other Ambulatory Visit: Payer: Self-pay | Admitting: General Surgery

## 2017-09-04 ENCOUNTER — Ambulatory Visit
Admission: RE | Admit: 2017-09-04 | Discharge: 2017-09-04 | Disposition: A | Discharge status: Home / Self Care | Payer: Managed Care, Other (non HMO) | Source: Ambulatory Visit | Attending: General Surgery | Admitting: General Surgery

## 2017-09-04 DIAGNOSIS — N6310 Unspecified lump in the right breast, unspecified quadrant: Secondary | ICD-10-CM

## 2017-09-04 DIAGNOSIS — Z1231 Encounter for screening mammogram for malignant neoplasm of breast: Secondary | ICD-10-CM

## 2017-09-12 ENCOUNTER — Ambulatory Visit
Admission: RE | Admit: 2017-09-12 | Discharge: 2017-09-12 | Disposition: A | Discharge status: Home / Self Care | Payer: Managed Care, Other (non HMO) | Source: Ambulatory Visit | Attending: General Surgery | Admitting: General Surgery

## 2017-09-12 ENCOUNTER — Ambulatory Visit: Payer: Managed Care, Other (non HMO) | Admitting: General Surgery

## 2017-09-12 DIAGNOSIS — N6311 Unspecified lump in the right breast, upper outer quadrant: Secondary | ICD-10-CM | POA: Insufficient documentation

## 2017-09-12 DIAGNOSIS — N6310 Unspecified lump in the right breast, unspecified quadrant: Secondary | ICD-10-CM

## 2017-09-12 DIAGNOSIS — N6001 Solitary cyst of right breast: Secondary | ICD-10-CM | POA: Insufficient documentation

## 2017-09-18 ENCOUNTER — Ambulatory Visit (INDEPENDENT_AMBULATORY_CARE_PROVIDER_SITE_OTHER): Payer: Managed Care, Other (non HMO) | Admitting: General Surgery

## 2017-09-18 ENCOUNTER — Encounter: Payer: Self-pay | Admitting: General Surgery

## 2017-09-18 VITALS — BP 98/68 | HR 70 | Resp 12 | Ht 69.0 in | Wt 141.0 lb

## 2017-09-18 DIAGNOSIS — D171 Benign lipomatous neoplasm of skin and subcutaneous tissue of trunk: Secondary | ICD-10-CM | POA: Diagnosis not present

## 2017-09-18 DIAGNOSIS — N6011 Diffuse cystic mastopathy of right breast: Secondary | ICD-10-CM

## 2017-09-18 NOTE — Progress Notes (Signed)
Patient ID: Virginia Rodriguez, female   DOB: 1976/02/16, 41 y.o.   MRN: [id number]  Chief Complaint  Patient presents with  . Follow-up    HPI [name] is a 41 y.o. female. who presents for a breast evaluation. The most recent mammogram was done on 09-12-17.  Patient does perform regular self breast checks and gets regular mammograms done.   She does feel like she has scar tissue right lateral breast from her heart surgery. She denies any further abdominal pain.   HPI  Past Medical History:  Diagnosis Date  . Anxiety   . Diffuse cystic mastopathy   . Mitral valve prolapse 06/2013    Past Surgical History:  Procedure Laterality Date  . ABDOMINAL HYSTERECTOMY  02/2014  . laparosopic pelvic  2012   with right oophorectomy  . MANDIBLE SURGERY  2001  . MITRAL VALVE REPAIR  July 2014  . TUBAL LIGATION  2003    Family History  Problem Relation Age of Onset  . Heart disease Mother   . Diabetes Father   . Heart disease Father   . Breast cancer Maternal Grandmother        great grandmother    Social History Social History  Substance Use Topics  . Smoking status: Never Smoker  . Smokeless tobacco: Never Used  . Alcohol use No    Allergies  Allergen Reactions  . Banana Anaphylaxis  . Macrobid [Nitrofurantoin Macrocrystal] Rash    Current Outpatient Prescriptions  Medication Sig Dispense Refill  . Adapalene-Benzoyl Peroxide 0.1-2.5 % gel Apply 1 applicator topically at bedtime. 45 g 6  . aspirin 81 MG tablet Take 81 mg by mouth daily.    Marland Kitchen escitalopram (LEXAPRO) 20 MG tablet TAKE 1 TABLET (20 MG TOTAL) BY MOUTH ONCE DAILY. 30 tablet 6  . valACYclovir (VALTREX) 1000 MG tablet Take 1 tablet (1,000 mg total) by mouth 2 (two) times daily. 60 tablet 6   No current facility-administered medications for this visit.     Review of Systems Review of Systems  Constitutional: Negative.   Respiratory: Negative.   Cardiovascular: Negative.     Blood pressure 98/68, pulse  70, resp. rate 12, height 5\' 9"  (1.753 m), weight 141 lb (64 kg), last menstrual period 10/19/2013.  Physical Exam Physical Exam  Constitutional: She is oriented to person, place, and time. She appears well-developed and well-nourished.  HENT:  Mouth/Throat: Oropharynx is clear and moist.  Eyes: Conjunctivae are normal. No scleral icterus.  Neck: Neck supple.  Cardiovascular: Normal rate, regular rhythm and normal heart sounds.   Pulmonary/Chest: Effort normal and breath sounds normal. No respiratory distress.     Right breast exhibits no inverted nipple, no mass, no nipple discharge, no skin change and no tenderness. Left breast exhibits no inverted nipple, no mass, no nipple discharge, no skin change and no tenderness.    Lymphadenopathy:    She has no cervical adenopathy.    She has no axillary adenopathy.  Neurological: She is alert and oriented to person, place, and time.  Skin: Skin is warm and dry.  Psychiatric: Her behavior is normal.    Data Reviewed Recent mammogram and prior notes reviewed.  Assessment     Right sided fibrocystic breast disease - stable mammogram and physical exam. The right lateral scar tissue from her mitral valve repair in 06/2013 is well healed without any nodules palpated around it, and patient was reassured. US of the lateral right breast in the area of the patient's  concern showed no findings.  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 Therefore, no current plans for excision.     Plan    The patient has been asked to follow up with Dr. Marcelline Mates in one year with a bilateral screening mammogram. Return to clinic if the lipoma continues to grow or patient experiences any discomfort.     HPI, Physical Exam, Assessment and Plan have been scribed under the direction and in the presence of Mckinley Jewel, MD Karie Fetch, RN  I have completed the exam and reviewed the above documentation for accuracy and completeness.  I agree with the above.   Haematologist has been used and any errors in dictation or transcription are unintentional.  Seeplaputhur G. Jamal Collin, M.D., F.A.C.S.    Junie Panning G 09/18/2017, 10:14 AM

## 2017-09-18 NOTE — Patient Instructions (Addendum)
The patient has been asked to follow up with Dr. Marcelline Mates in one year with a bilateral screening mammogram. Return to clinic if the lipoma continues to grow or patient experiences any discomfort.

## 2017-10-29 ENCOUNTER — Other Ambulatory Visit: Payer: Self-pay | Admitting: *Deleted

## 2017-10-29 MED ORDER — AMOXICILLIN 500 MG PO CAPS: 500.0000 mg | ORAL_CAPSULE | Freq: Three times a day (TID) | ORAL | 1 refills | Status: DC

## 2018-03-13 ENCOUNTER — Other Ambulatory Visit: Payer: Self-pay | Admitting: *Deleted

## 2018-03-13 MED ORDER — FLUCONAZOLE 150 MG PO TABS: 150.0000 mg | ORAL_TABLET | Freq: Once | ORAL | 3 refills | Status: AC

## 2018-03-13 MED ORDER — TERCONAZOLE 0.4 % VA CREA: 1.0000 | TOPICAL_CREAM | Freq: Every day | VAGINAL | 0 refills | Status: DC

## 2018-05-14 ENCOUNTER — Other Ambulatory Visit: Payer: Self-pay | Admitting: *Deleted

## 2018-05-14 MED ORDER — LEVOFLOXACIN 500 MG PO TABS: 500.0000 mg | ORAL_TABLET | Freq: Every day | ORAL | 0 refills | Status: DC

## 2018-06-30 ENCOUNTER — Other Ambulatory Visit: Payer: Self-pay | Admitting: *Deleted

## 2018-06-30 MED ORDER — CIPROFLOXACIN-DEXAMETHASONE 0.3-0.1 % OT SUSP: 4.0000 [drp] | Freq: Two times a day (BID) | OTIC | 2 refills | Status: DC

## 2018-08-06 ENCOUNTER — Other Ambulatory Visit: Payer: Self-pay | Admitting: Obstetrics and Gynecology

## 2018-08-06 DIAGNOSIS — Z1231 Encounter for screening mammogram for malignant neoplasm of breast: Secondary | ICD-10-CM

## 2018-09-12 ENCOUNTER — Other Ambulatory Visit: Payer: Self-pay | Admitting: Internal Medicine

## 2018-09-12 DIAGNOSIS — R05 Cough: Secondary | ICD-10-CM

## 2018-09-12 DIAGNOSIS — R053 Chronic cough: Secondary | ICD-10-CM

## 2018-09-15 ENCOUNTER — Ambulatory Visit
Admission: RE | Admit: 2018-09-15 | Discharge: 2018-09-15 | Disposition: A | Discharge status: Home / Self Care | Payer: PRIVATE HEALTH INSURANCE | Source: Ambulatory Visit | Attending: Obstetrics and Gynecology | Admitting: Obstetrics and Gynecology

## 2018-09-15 DIAGNOSIS — Z1231 Encounter for screening mammogram for malignant neoplasm of breast: Secondary | ICD-10-CM | POA: Diagnosis present

## 2018-09-16 ENCOUNTER — Ambulatory Visit
Admission: RE | Admit: 2018-09-16 | Discharge: 2018-09-16 | Disposition: A | Discharge status: Home / Self Care | Payer: PRIVATE HEALTH INSURANCE | Source: Ambulatory Visit | Attending: Internal Medicine | Admitting: Internal Medicine

## 2018-09-16 DIAGNOSIS — R918 Other nonspecific abnormal finding of lung field: Secondary | ICD-10-CM | POA: Diagnosis not present

## 2018-09-16 DIAGNOSIS — Z9889 Other specified postprocedural states: Secondary | ICD-10-CM | POA: Diagnosis not present

## 2018-09-16 DIAGNOSIS — R05 Cough: Secondary | ICD-10-CM | POA: Diagnosis not present

## 2018-09-16 DIAGNOSIS — R053 Chronic cough: Secondary | ICD-10-CM

## 2018-09-22 ENCOUNTER — Ambulatory Visit: Payer: Self-pay

## 2019-04-14 ENCOUNTER — Other Ambulatory Visit: Payer: Self-pay | Admitting: *Deleted

## 2019-04-14 MED ORDER — ESCITALOPRAM OXALATE 20 MG PO TABS: ORAL_TABLET | ORAL | 6 refills | Status: DC

## 2019-08-13 ENCOUNTER — Other Ambulatory Visit: Payer: Self-pay | Admitting: Internal Medicine

## 2019-08-13 DIAGNOSIS — Z1231 Encounter for screening mammogram for malignant neoplasm of breast: Secondary | ICD-10-CM

## 2019-09-17 ENCOUNTER — Ambulatory Visit
Admission: RE | Admit: 2019-09-17 | Discharge: 2019-09-17 | Disposition: A | Discharge status: Home / Self Care | Payer: BLUE CROSS/BLUE SHIELD | Source: Ambulatory Visit | Attending: Internal Medicine | Admitting: Internal Medicine

## 2019-09-17 DIAGNOSIS — Z1231 Encounter for screening mammogram for malignant neoplasm of breast: Secondary | ICD-10-CM | POA: Diagnosis not present

## 2019-09-21 ENCOUNTER — Other Ambulatory Visit: Payer: Self-pay | Admitting: Internal Medicine

## 2019-09-21 DIAGNOSIS — N6489 Other specified disorders of breast: Secondary | ICD-10-CM

## 2019-09-21 DIAGNOSIS — R928 Other abnormal and inconclusive findings on diagnostic imaging of breast: Secondary | ICD-10-CM

## 2019-10-01 ENCOUNTER — Ambulatory Visit
Admission: RE | Admit: 2019-10-01 | Discharge: 2019-10-01 | Disposition: A | Discharge status: Home / Self Care | Payer: PRIVATE HEALTH INSURANCE | Source: Ambulatory Visit | Attending: Internal Medicine | Admitting: Internal Medicine

## 2019-10-01 DIAGNOSIS — N6489 Other specified disorders of breast: Secondary | ICD-10-CM | POA: Insufficient documentation

## 2019-10-01 DIAGNOSIS — R928 Other abnormal and inconclusive findings on diagnostic imaging of breast: Secondary | ICD-10-CM | POA: Diagnosis not present

## 2019-10-02 ENCOUNTER — Other Ambulatory Visit: Payer: Self-pay | Admitting: Internal Medicine

## 2019-10-02 DIAGNOSIS — N6310 Unspecified lump in the right breast, unspecified quadrant: Secondary | ICD-10-CM

## 2019-10-02 DIAGNOSIS — R928 Other abnormal and inconclusive findings on diagnostic imaging of breast: Secondary | ICD-10-CM

## 2019-10-07 ENCOUNTER — Ambulatory Visit
Admission: RE | Admit: 2019-10-07 | Discharge: 2019-10-07 | Disposition: A | Discharge status: Home / Self Care | Payer: BLUE CROSS/BLUE SHIELD | Source: Ambulatory Visit | Attending: Internal Medicine | Admitting: Internal Medicine

## 2019-10-07 DIAGNOSIS — R928 Other abnormal and inconclusive findings on diagnostic imaging of breast: Secondary | ICD-10-CM

## 2019-10-07 DIAGNOSIS — N6310 Unspecified lump in the right breast, unspecified quadrant: Secondary | ICD-10-CM

## 2019-10-07 HISTORY — PX: BREAST BIOPSY: SHX20

## 2019-10-08 ENCOUNTER — Other Ambulatory Visit: Payer: Self-pay | Admitting: Internal Medicine

## 2019-10-08 DIAGNOSIS — N6489 Other specified disorders of breast: Secondary | ICD-10-CM

## 2019-10-08 DIAGNOSIS — R928 Other abnormal and inconclusive findings on diagnostic imaging of breast: Secondary | ICD-10-CM

## 2019-10-08 LAB — SURGICAL PATHOLOGY

## 2019-10-15 ENCOUNTER — Ambulatory Visit
Admission: RE | Admit: 2019-10-15 | Discharge: 2019-10-15 | Disposition: A | Discharge status: Home / Self Care | Payer: BLUE CROSS/BLUE SHIELD | Source: Ambulatory Visit | Attending: Internal Medicine | Admitting: Internal Medicine

## 2019-10-15 DIAGNOSIS — R928 Other abnormal and inconclusive findings on diagnostic imaging of breast: Secondary | ICD-10-CM

## 2019-10-15 DIAGNOSIS — N6489 Other specified disorders of breast: Secondary | ICD-10-CM

## 2019-10-15 HISTORY — PX: BREAST BIOPSY: SHX20

## 2019-10-16 LAB — SURGICAL PATHOLOGY

## 2020-05-17 ENCOUNTER — Other Ambulatory Visit: Payer: Self-pay | Admitting: Obstetrics and Gynecology

## 2020-05-17 DIAGNOSIS — Z1231 Encounter for screening mammogram for malignant neoplasm of breast: Secondary | ICD-10-CM

## 2020-11-03 ENCOUNTER — Ambulatory Visit
Admission: RE | Admit: 2020-11-03 | Discharge: 2020-11-03 | Disposition: A | Discharge status: Home / Self Care | Payer: 59 | Source: Ambulatory Visit | Attending: Obstetrics and Gynecology | Admitting: Obstetrics and Gynecology

## 2020-11-03 ENCOUNTER — Other Ambulatory Visit: Payer: Self-pay

## 2020-11-03 DIAGNOSIS — Z1231 Encounter for screening mammogram for malignant neoplasm of breast: Secondary | ICD-10-CM | POA: Diagnosis present

## 2020-11-08 ENCOUNTER — Other Ambulatory Visit (HOSPITAL_COMMUNITY): Payer: Self-pay | Admitting: Physician Assistant

## 2020-11-08 ENCOUNTER — Other Ambulatory Visit: Payer: Self-pay | Admitting: Physician Assistant

## 2020-11-08 DIAGNOSIS — M25559 Pain in unspecified hip: Secondary | ICD-10-CM

## 2020-11-08 DIAGNOSIS — M76891 Other specified enthesopathies of right lower limb, excluding foot: Secondary | ICD-10-CM

## 2020-11-20 ENCOUNTER — Other Ambulatory Visit: Payer: Self-pay

## 2020-11-20 ENCOUNTER — Ambulatory Visit
Admission: RE | Admit: 2020-11-20 | Discharge: 2020-11-20 | Disposition: A | Discharge status: Home / Self Care | Payer: 59 | Source: Ambulatory Visit | Attending: Physician Assistant | Admitting: Physician Assistant

## 2020-11-20 DIAGNOSIS — M76891 Other specified enthesopathies of right lower limb, excluding foot: Secondary | ICD-10-CM | POA: Diagnosis present

## 2020-11-20 DIAGNOSIS — M25559 Pain in unspecified hip: Secondary | ICD-10-CM | POA: Diagnosis not present

## 2021-10-16 ENCOUNTER — Other Ambulatory Visit: Payer: Self-pay | Admitting: Obstetrics and Gynecology

## 2021-10-16 DIAGNOSIS — Z1231 Encounter for screening mammogram for malignant neoplasm of breast: Secondary | ICD-10-CM

## 2022-03-19 ENCOUNTER — Other Ambulatory Visit: Payer: Self-pay | Admitting: Physician Assistant

## 2022-03-19 ENCOUNTER — Ambulatory Visit
Admission: RE | Admit: 2022-03-19 | Discharge: 2022-03-19 | Disposition: A | Discharge status: Home / Self Care | Payer: BC Managed Care – PPO | Source: Ambulatory Visit | Attending: Physician Assistant | Admitting: Physician Assistant

## 2022-03-19 DIAGNOSIS — R109 Unspecified abdominal pain: Secondary | ICD-10-CM

## 2022-03-19 DIAGNOSIS — R1031 Right lower quadrant pain: Secondary | ICD-10-CM | POA: Insufficient documentation

## 2022-07-01 ENCOUNTER — Ambulatory Visit
Admission: EM | Admit: 2022-07-01 | Discharge: 2022-07-01 | Disposition: A | Discharge status: Home / Self Care | Payer: BC Managed Care – PPO

## 2022-07-01 DIAGNOSIS — L03011 Cellulitis of right finger: Secondary | ICD-10-CM

## 2022-07-01 MED ORDER — MUPIROCIN 2 % EX OINT: 1.0000 | TOPICAL_OINTMENT | Freq: Two times a day (BID) | CUTANEOUS | 0 refills | Status: DC

## 2022-07-01 MED ORDER — CEFDINIR 300 MG PO CAPS: 300.0000 mg | ORAL_CAPSULE | Freq: Two times a day (BID) | ORAL | 0 refills | Status: AC

## 2022-07-01 NOTE — Discharge Instructions (Addendum)
-  There is a skin infection. - Warm compresses to the area. - Clean with soap and water and dry well.  Apply the mupirocin ointment. - Try to leave open to air sometimes. - I have sent antibiotics to pharmacy for you.  Take full course. - Return if you develop increased redness or swelling or red streak up your finger/hand or if you develop a fever.

## 2022-07-01 NOTE — ED Triage Notes (Signed)
Pt c/o injury to 3rd finger on right hand.   Pt stabbed her finger with a Butcher knife while washing dishes and states that it has gotten more painful.   Pt states it "feels like a burn and feels raw".  Pt states that the initial cut looked like a paper cut.

## 2022-07-01 NOTE — ED Provider Notes (Signed)
[facility]    CSN: [id number] Arrival date & time: 07/01/22  1315      History   Chief Complaint Chief Complaint  Patient presents with   Finger Injury    HPI Virginia Rodriguez is a 46 y.o. female presenting for pain of right middle finger.  Patient reports 6 days ago she was washing dishes and actually punctured her finger on a butcher knife.  She reports that she has been cleaning it with hydrogen peroxide and keeping a Band-Aid on it.  She also says she is apply Bactroban but over the past few days she developed increased redness and swelling and even reports pushing pus out of the cut.  She is afraid it is infected now.  She reports some aching into the joint of her finger as well.  She has not any numbness or tingling and denies any fevers.  HPI  Past Medical History:  Diagnosis Date   Anxiety    Diffuse cystic mastopathy    Mitral valve prolapse 06/2013    There are no problems to display for this patient.   Past Surgical History:  Procedure Laterality Date   ABDOMINAL HYSTERECTOMY  02/2014   BREAST BIOPSY Right 10/07/2019   u/s bx, heart marker, BENIGN MAMMARY PARENCHYMA WITH FIBROCYSTIC CHANGES. - NEGATIVE FOR   BREAST BIOPSY Right 10/15/2019   stereo bx, coil marker and ribbon marker deployed in same area, path pending   BREAST EXCISIONAL BIOPSY Right 1995   benign   laparosopic pelvic  2012   with right oophorectomy   MANDIBLE SURGERY  2001   MITRAL VALVE REPAIR  July 2014   TUBAL LIGATION  2003    OB History     Gravida  2   Para  2   Term  2   Preterm      AB      Living  2      SAB      IAB      Ectopic      Multiple      Live Births  2        Obstetric Comments  1st Menstrual Cycle:  12 1st Pregnancy:  23          Home Medications    Prior to Admission medications   Medication Sig Start Date End Date Taking? Authorizing Provider  aspirin 81 MG tablet Take 81 mg by mouth daily.   Yes [provider]   cefdinir (OMNICEF) 300 MG capsule Take 1 capsule (300 mg total) by mouth 2 (two) times daily for 7 days. 07/01/22 07/08/22 Yes Danton Clap, PA-C  mupirocin ointment (BACTROBAN) 2 % Apply 1 Application topically 2 (two) times daily. 07/01/22  Yes Laurene Footman B, PA-C  sertraline (ZOLOFT) 50 MG tablet Take 1 tablet by mouth daily. 12/14/20  Yes [provider]  Adapalene-Benzoyl Peroxide 0.1-2.5 % gel Apply 1 applicator topically at bedtime. 05/08/17   Shambley, Melody N, CNM  escitalopram (LEXAPRO) 20 MG tablet TAKE 1 TABLET (20 MG TOTAL) BY MOUTH ONCE DAILY. 04/14/19   Joylene Igo, CNM    Family History Family History  Problem Relation Age of Onset   Heart disease Mother    Diabetes Father    Heart disease Father    Breast cancer Maternal Grandmother        great grandmother    Social History Social History   Tobacco Use   Smoking status: Never   Smokeless tobacco:  Never  Vaping Use   Vaping Use: Never used  Substance Use Topics   Alcohol use: No   Drug use: No     Allergies   Banana and Macrobid [nitrofurantoin macrocrystal]   Review of Systems Review of Systems  Constitutional:  Negative for fever.  Musculoskeletal:  Negative for arthralgias and joint swelling.  Skin:  Positive for color change and wound.  Neurological:  Negative for weakness and numbness.     Physical Exam Triage Vital Signs ED Triage Vitals  Enc Vitals Group     BP      Pulse      Resp      Temp      Temp src      SpO2      Weight      Height      Head Circumference      Peak Flow      Pain Score      Pain Loc      Pain Edu?      Excl. in [place]?    No data found.  Updated Vital Signs BP 106/70 (BP Location: Left Arm)   Pulse 70   Temp 98.6 F (37 C) (Oral)   Resp 18   Ht '5\' 8"'$  (1.727 m)   Wt 132 lb (59.9 kg)   LMP 10/19/2013   SpO2 99%   BMI 20.07 kg/m      Physical Exam Vitals and nursing note reviewed.  Constitutional:      General: She is not in  acute distress.    Appearance: Normal appearance. She is not ill-appearing or toxic-appearing.  HENT:     Head: Normocephalic and atraumatic.  Eyes:     General: No scleral icterus.       Right eye: No discharge.        Left eye: No discharge.     Conjunctiva/sclera: Conjunctivae normal.  Cardiovascular:     Rate and Rhythm: Normal rate and regular rhythm.     Pulses: Normal pulses.  Pulmonary:     Effort: Pulmonary effort is normal. No respiratory distress.  Musculoskeletal:     Cervical back: Neck supple.     Comments: Right third finger: There is a linear laceration which is healing of the middle of the digit dorsally.  Surrounding this, there is a lot of erythema and swelling.  Area is very tender to palpation and she reports tenderness when I palpate the PIP joint.  Full range of motion.  Skin:    General: Skin is dry.  Neurological:     General: No focal deficit present.     Mental Status: She is alert. Mental status is at baseline.     Motor: No weakness.     Gait: Gait normal.  Psychiatric:        Mood and Affect: Mood normal.        Behavior: Behavior normal.        Thought Content: Thought content normal.      UC Treatments / Results  Labs (all labs ordered are listed, but only abnormal results are displayed) Labs Reviewed - No data to display  EKG   Radiology No results found.  Procedures Procedures (including critical care time)  Medications Ordered in UC Medications - No data to display  Initial Impression / Assessment and Plan / UC Course  I have reviewed the triage vital signs and the nursing notes.  Pertinent labs & imaging results that were available  during my care of the patient were reviewed by me and considered in my medical decision making (see chart for details).  46 year old female presenting for pain and swelling as well as redness around the cut of the right third finger.  She initially cut the finger 6 days ago.  On evaluation, the area  is consistent with cellulitis that appears to be mild.  Full range of motion of the joints of her finger.  We will treat at this time with cefdinir.  Also sent mupirocin ointment.  We discussed only cleaning with hydrogen peroxide initially and then after that with plain soap and water.  Advised trying to leave her Band-Aid off so the wound can breathe.  Advised returning for any worsening of symptoms.   Final Clinical Impressions(s) / UC Diagnoses   Final diagnoses:  Cellulitis of finger of right hand     Discharge Instructions      -There is a skin infection. - Warm compresses to the area. - Clean with soap and water and dry well.  Apply the mupirocin ointment. - Try to leave open to air sometimes. - I have sent antibiotics to pharmacy for you.  Take full course. - Return if you develop increased redness or swelling or red streak up your finger/hand or if you develop a fever.     ED Prescriptions     Medication Sig Dispense Auth. Provider   mupirocin ointment (BACTROBAN) 2 % Apply 1 Application topically 2 (two) times daily. 22 g Laurene Footman B, PA-C   cefdinir (OMNICEF) 300 MG capsule Take 1 capsule (300 mg total) by mouth 2 (two) times daily for 7 days. 14 capsule Danton Clap, PA-C      PDMP not reviewed this encounter.   Danton Clap, PA-C 07/01/22 1424

## 2022-09-07 HISTORY — PX: COLONOSCOPY: SHX174

## 2022-10-11 ENCOUNTER — Other Ambulatory Visit: Payer: Self-pay

## 2022-10-11 ENCOUNTER — Ambulatory Visit
Admission: EM | Admit: 2022-10-11 | Discharge: 2022-10-11 | Disposition: A | Discharge status: Home / Self Care | Payer: BC Managed Care – PPO

## 2022-10-11 DIAGNOSIS — B9789 Other viral agents as the cause of diseases classified elsewhere: Secondary | ICD-10-CM | POA: Diagnosis not present

## 2022-10-11 DIAGNOSIS — J329 Chronic sinusitis, unspecified: Secondary | ICD-10-CM

## 2022-10-11 DIAGNOSIS — R051 Acute cough: Secondary | ICD-10-CM

## 2022-10-11 DIAGNOSIS — R0981 Nasal congestion: Secondary | ICD-10-CM

## 2022-10-11 MED ORDER — PREDNISONE 20 MG PO TABS: 40.0000 mg | ORAL_TABLET | Freq: Every day | ORAL | 0 refills | Status: AC

## 2022-10-11 MED ORDER — IPRATROPIUM BROMIDE 0.06 % NA SOLN: 2.0000 | Freq: Four times a day (QID) | NASAL | 0 refills | Status: DC

## 2022-10-11 MED ORDER — PROMETHAZINE-DM 6.25-15 MG/5ML PO SYRP: 5.0000 mL | ORAL_SOLUTION | Freq: Every evening | ORAL | 0 refills | Status: DC

## 2022-10-11 NOTE — ED Triage Notes (Signed)
Pt endorses cough, nasal congestion, nose feels like its burning, teeth pain. "I think I have a sinus infection." Tried a Z pack but reports "it takes two rounds to get rid of my sinus infections.

## 2022-10-11 NOTE — ED Provider Notes (Signed)
[facility]    CSN: [id number] Arrival date & time: 10/11/22  1826      History   Chief Complaint Chief Complaint  Patient presents with   Nasal Congestion   Dental Pain    HPI Virginia Rodriguez is a 46 y.o. female presenting for 1 week history of cough, nasal congestion, sinus pressure and dental pain. Patient says she has a sinus infection. She has completed a course of azithromycin and asks for a second round.  Has a been taking benzonatate.  She reports that she contacted her PCP clinic with her symptoms and they prescribed the azithromycin but she was not seen in person.  She reports that she called back to get more medication but they wanted her to be seen in person.  Patient reports she had a temperature of 100 degrees the first day of symptoms.  She says she does not feel any better or worse than she did when symptoms began.  No report of any sore throat, chest pain, wheezing or breathing difficulty.  No report of any sick contacts.  Patient declines COVID test.  No other complaints.  HPI  Past Medical History:  Diagnosis Date   Anxiety    Diffuse cystic mastopathy    Mitral valve prolapse 06/2013    There are no problems to display for this patient.   Past Surgical History:  Procedure Laterality Date   ABDOMINAL HYSTERECTOMY  02/2014   BREAST BIOPSY Right 10/07/2019   u/s bx, heart marker, BENIGN MAMMARY PARENCHYMA WITH FIBROCYSTIC CHANGES. - NEGATIVE FOR   BREAST BIOPSY Right 10/15/2019   stereo bx, coil marker and ribbon marker deployed in same area, path pending   BREAST EXCISIONAL BIOPSY Right 1995   benign   laparosopic pelvic  2012   with right oophorectomy   MANDIBLE SURGERY  2001   MITRAL VALVE REPAIR  July 2014   TUBAL LIGATION  2003    OB History     Gravida  2   Para  2   Term  2   Preterm      AB      Living  2      SAB      IAB      Ectopic      Multiple      Live Births  2        Obstetric Comments  1st  Menstrual Cycle:  12 1st Pregnancy:  23          Home Medications    Prior to Admission medications   Medication Sig Start Date End Date Taking? Authorizing Provider  ipratropium (ATROVENT) 0.06 % nasal spray Place 2 sprays into both nostrils 4 (four) times daily. 10/11/22  Yes Danton Clap, PA-C  predniSONE (DELTASONE) 20 MG tablet Take 2 tablets (40 mg total) by mouth daily for 5 days. 10/11/22 10/16/22 Yes Danton Clap, PA-C  promethazine-dextromethorphan (PROMETHAZINE-DM) 6.25-15 MG/5ML syrup Take 5 mLs by mouth at bedtime. 10/11/22  Yes Laurene Footman B, PA-C  Adapalene-Benzoyl Peroxide 0.1-2.5 % gel Apply 1 applicator topically at bedtime. 05/08/17   Shambley, Melody N, CNM  aspirin 81 MG tablet Take 81 mg by mouth daily.    [provider]  aspirin EC 81 MG tablet Take by mouth.    [provider]  benzonatate (TESSALON) 200 MG capsule Take 200 mg by mouth 3 (three) times daily as needed. 10/05/22   [provider]  escitalopram (LEXAPRO) 20 MG  tablet TAKE 1 TABLET (20 MG TOTAL) BY MOUTH ONCE DAILY. 04/14/19   Shambley, Melody N, CNM  mupirocin ointment (BACTROBAN) 2 % Apply 1 Application topically 2 (two) times daily. 07/01/22   Laurene Footman B, PA-C  sertraline (ZOLOFT) 50 MG tablet Take 1 tablet by mouth daily. 12/14/20   [provider]    Family History Family History  Problem Relation Age of Onset   Heart disease Mother    Diabetes Father    Heart disease Father    Breast cancer Maternal Grandmother        great grandmother    Social History Social History   Tobacco Use   Smoking status: Never   Smokeless tobacco: Never  Vaping Use   Vaping Use: Never used  Substance Use Topics   Alcohol use: No   Drug use: No     Allergies   Banana and Macrobid [nitrofurantoin macrocrystal]   Review of Systems Review of Systems  Constitutional:  Positive for fatigue. Negative for chills, diaphoresis and fever.  HENT:  Positive  for congestion, rhinorrhea, sinus pressure and sinus pain. Negative for ear pain and sore throat.   Respiratory:  Positive for cough. Negative for shortness of breath.   Cardiovascular:  Negative for chest pain.  Gastrointestinal:  Negative for abdominal pain, nausea and vomiting.  Musculoskeletal:  Negative for arthralgias and myalgias.  Skin:  Negative for rash.  Neurological:  Negative for weakness and headaches.  Hematological:  Negative for adenopathy.     Physical Exam Triage Vital Signs ED Triage Vitals  Enc Vitals Group     BP      Pulse      Resp      Temp      Temp src      SpO2      Weight      Height      Head Circumference      Peak Flow      Pain Score      Pain Loc      Pain Edu?      Excl. in [place]?    No data found.  Updated Vital Signs BP (!) 104/55 (BP Location: Right Arm)   Pulse 67   Temp 98.5 F (36.9 C) (Oral)   Resp 17   Ht '5\' 8"'$  (1.727 m)   Wt 130 lb (59 kg)   LMP 10/19/2013   SpO2 100%   BMI 19.77 kg/m     Physical Exam Vitals and nursing note reviewed.  Constitutional:      General: She is not in acute distress.    Appearance: Normal appearance. She is not ill-appearing or toxic-appearing.  HENT:     Head: Normocephalic and atraumatic.     Right Ear: Tympanic membrane, ear canal and external ear normal.     Left Ear: Tympanic membrane, ear canal and external ear normal.     Nose: Congestion present.     Right Sinus: Maxillary sinus tenderness present.     Left Sinus: Maxillary sinus tenderness present.     Mouth/Throat:     Mouth: Mucous membranes are moist.     Pharynx: Oropharynx is clear.  Eyes:     General: No scleral icterus.       Right eye: No discharge.        Left eye: No discharge.     Conjunctiva/sclera: Conjunctivae normal.  Cardiovascular:     Rate and Rhythm: Normal rate and regular rhythm.  Heart sounds: Normal heart sounds.  Pulmonary:     Effort: Pulmonary effort is normal. No respiratory distress.      Breath sounds: Normal breath sounds.  Musculoskeletal:     Cervical back: Neck supple.  Skin:    General: Skin is dry.  Neurological:     General: No focal deficit present.     Mental Status: She is alert. Mental status is at baseline.     Motor: No weakness.     Gait: Gait normal.  Psychiatric:        Mood and Affect: Mood normal.        Behavior: Behavior normal.        Thought Content: Thought content normal.      UC Treatments / Results  Labs (all labs ordered are listed, but only abnormal results are displayed) Labs Reviewed - No data to display  EKG   Radiology No results found.  Procedures Procedures (including critical care time)  Medications Ordered in UC Medications - No data to display  Initial Impression / Assessment and Plan / UC Course  I have reviewed the triage vital signs and the nursing notes.  Pertinent labs & imaging results that were available during my care of the patient were reviewed by me and considered in my medical decision making (see chart for details).   46 year old female presents for nasal congestion, cough and sinus pressure for the past 1 week.  Patient has taken azithromycin course and believes she needs a second course for sinusitis.  Completed azithromycin yesterday.  Vitals are stable and she is overall well-appearing.  On exam she does have nasal congestion without drainage.  Tenderness to palpation of maxillary sinuses.  Her chest is clear auscultation heart regular rate and rhythm.  Had a discussion with patient that her symptoms are most likely due to a virus.  Advised her that she should be feeling better over the next week.  Asked if she had taken the Mucinex D as prescribed by her PCP but she says she did not receive that medication.  Advised her that she should start that one as I think will be very helpful.  I also sent Atrovent nasal spray and Promethazine DM for her to take at bedtime.  She states that her cough has kept her  awake sometimes at night.  She states that sometimes when she has a sinus infection she gets better with prednisone.  I printed a prescription for prednisone and advised her that she may take this if she is not improving in the next 3 days with the other medications but I expect that she should be.  We will hold off on more antibiotics at this time as she has only been sick for 1 week and already completed a course of antibiotics.  Advised her to follow-up with her PCP if not improving.  Advised her to return here if her symptoms are worsening.   Final Clinical Impressions(s) / UC Diagnoses   Final diagnoses:  Viral sinusitis  Acute cough  Nasal congestion     Discharge Instructions      -Symptoms are most consistent with a virus. - Start Mucinex D.  This is a medication that you need to asked the pharmacy staff for.  It is behind the counter.  You will need to show an ID. - I have sent a nasal spray for you to cough medication for you to use at bedtime.  Increase your rest and fluids. - You may  take Tylenol as needed for discomfort. - If no improvement in 3 days you can start the prednisone. - If you have a fever, worsening sinus pain, chest pain or shortness of breath, please return or follow-up with your PCP for reevaluation.  URI/COLD SYMPTOMS: Your exam today is consistent with a viral illness. Antibiotics are not indicated at this time. Use medications as directed, including cough syrup, nasal saline, and decongestants. Your symptoms should improve over the next few days and resolve within another 7-10 days. Increase rest and fluids. F/u if symptoms worsen or predominate such as sore throat, ear pain, productive cough, shortness of breath, or if you develop high fevers or worsening fatigue over the next several days.       ED Prescriptions     Medication Sig Dispense Auth. Provider   promethazine-dextromethorphan (PROMETHAZINE-DM) 6.25-15 MG/5ML syrup Take 5 mLs by mouth at  bedtime. 118 mL Laurene Footman B, PA-C   ipratropium (ATROVENT) 0.06 % nasal spray Place 2 sprays into both nostrils 4 (four) times daily. 15 mL Laurene Footman B, PA-C   predniSONE (DELTASONE) 20 MG tablet Take 2 tablets (40 mg total) by mouth daily for 5 days. 10 tablet Gretta Cool      PDMP not reviewed this encounter.   Danton Clap, PA-C 10/11/22 1905

## 2022-10-11 NOTE — Discharge Instructions (Addendum)
-  Symptoms are most consistent with a virus. - Start Mucinex D.  This is a medication that you need to asked the pharmacy staff for.  It is behind the counter.  You will need to show an ID. - I have sent a nasal spray for you to cough medication for you to use at bedtime.  Increase your rest and fluids. - You may take Tylenol as needed for discomfort. - If no improvement in 3 days you can start the prednisone. - If you have a fever, worsening sinus pain, chest pain or shortness of breath, please return or follow-up with your PCP for reevaluation.  URI/COLD SYMPTOMS: Your exam today is consistent with a viral illness. Antibiotics are not indicated at this time. Use medications as directed, including cough syrup, nasal saline, and decongestants. Your symptoms should improve over the next few days and resolve within another 7-10 days. Increase rest and fluids. F/u if symptoms worsen or predominate such as sore throat, ear pain, productive cough, shortness of breath, or if you develop high fevers or worsening fatigue over the next several days.

## 2022-12-10 ENCOUNTER — Other Ambulatory Visit: Payer: Self-pay | Admitting: Obstetrics and Gynecology

## 2022-12-10 NOTE — H&P (Signed)
Virginia Rodriguez is a 46 y.o. female here for Follow-up (U/s follow up - pelvic pain)   Referring provider: Genice Rouge*   History of Present Illness: Patient with intermittent pelvic cramping returns today for ultrasound.     Today: still having her pain, no constipation, no diarrhea, no LOA. Ibuprofen helps    TVUS Today Hysterectomy Rt ovary removed Lt ovary volume=12.52m Lt ovary contains 2 complex cysts:1)1.8cm;  septation=0.44cm  2)2.5cm No free fluid seen  CA 125 and HE4 wnl on 12/04/2022    Pertinent hx: - S/p abdominal hysterectomy in 02/2014 with right oophorectomy per pt, for fibroids - Tubal in 2014 and then hyst later    -Hx of cystic mastopathy  -Mitral valve prolapse, follows cardio   -MGM with breast cancer    Past Medical History:  has a past medical history of Anxiety, CHF (congestive heart failure) (CMS-HCC), Cystic mastopathy, Depression, Fibrocystic breast disease, Genital herpes simplex, History of uterine fibroid, Mitral valve prolapse, Rotator cuff tendinitis, left (09/27/2015), Tension headache, and UTI (urinary tract infection).  Past Surgical History:  has a past surgical history that includes Laparoscopic tubal ligation (2003); Open Reduction Mandibular Alveolar Ridge Fracture (2000); transesophageal echocardiography (N/A, 07/14/2013); Heart catheterization, negative. (08/26/2009); Right breast biopsy; Diagnostic laparoscopy (2004); Diagnostic laparoscopy (12/14/2011); Laparoscopic Assisted Vaginal Hysterectomy (02/26/2014); Cardiac surgery; and colonoscopy (N/A, 09/07/2022). Family History: family history includes Breast cancer in her maternal great-grandmother; Colon polyps in her paternal grandmother; Coronary Artery Disease (Blocked arteries around heart) in her father; Diabetes type II in her father; Heart failure in her maternal grandmother and paternal grandfather; High blood pressure (Hypertension) in her father; Mitral valve prolapse in her  mother. Social History:  reports that she has never smoked. She has never used smokeless tobacco. She reports that she does not currently use alcohol. She reports that she does not use drugs. OB/GYN History:  OB History       Gravida 2   Para 2   Term 2   Preterm     AB     Living 2       SAB     IAB     Ectopic     Molar     Multiple     Live Births 2        Allergies: is allergic to banana and macrobid [nitrofurantoin monohyd/m-cryst]. Medications: Current Outpatient Medications:    aspirin 81 MG EC tablet, Take 81 mg by mouth once daily., Disp: , Rfl:    ondansetron (ZOFRAN-ODT) 4 MG disintegrating tablet, Take 1 tablet (4 mg total) by mouth every 8 (eight) hours as needed for Nausea, Disp: 5 tablet, Rfl: 0   sertraline (ZOLOFT) 100 MG tablet, Take 1 tablet (100 mg total) by mouth once daily, Disp: 90 tablet, Rfl: 3   amoxicillin (AMOXIL) 500 MG tablet, Take 4 tablets equal to 2 g 1 hour prior to dental visits or other procedures likely to cause bacteremia. (Patient not taking: Reported on 11/02/2022), Disp: 20 tablet, Rfl: 2   benzonatate (TESSALON) 200 MG capsule, Take 1 capsule (200 mg total) by mouth 3 (three) times daily as needed for Cough (Patient not taking: Reported on 11/02/2022), Disp: 30 capsule, Rfl: 0   Review of Systems: No SOB, no palpitations or chest pain, no new lower extremity edema, no nausea or vomiting or bowel or bladder complaints. See HPI for gyn specific ROS.    Exam:   Vitals:   12/04/22 1413 BP: 109/64 Pulse: 74  Constitutional:  General appearance: Well nourished, well developed female in no acute distress.  Neuro/psych:  Normal mood and affect. No gross motor deficits. Neck:  Supple, normal appearance.  Respiratory:  Normal respiratory effort, no use of accessory muscles Skin:  No visible rashes or external lesions     Abdominal: left lower quad pain, carnett's neg, +TTP, +involuntary guarding   Pelvic:  deferred   Impression:   The primary encounter diagnosis was Complex ovarian cyst. A diagnosis of Pelvic pain in female was also pertinent to this visit.   Plan:   - LLQ pain, LO cyst  - Reviewed ultrasound results and images with pt today. I reviewed the risk of ovarian malignancy and discussed options for expectant management, surgical evaluation.  - She would like to proceed with surgical evaluation.    - Return for preop for RA Dx lap with left ovarian cystectomy and possible oophorectomy, possible lysis of adhesions and excision of endometriosis    - Warning s/s of ovarian malignancy and ovarian torsion reviewed.      Diagnoses and all orders for this visit:   Complex ovarian cyst -     Cancer Antigen (CA) 125 - [facility] -     Human Epididymis Protein 4 - [facility]

## 2022-12-11 ENCOUNTER — Encounter
Admission: RE | Admit: 2022-12-11 | Discharge: 2022-12-11 | Disposition: A | Discharge status: Home / Self Care | Payer: BC Managed Care – PPO | Source: Ambulatory Visit | Attending: Obstetrics and Gynecology | Admitting: Obstetrics and Gynecology

## 2022-12-11 VITALS — Ht 68.0 in | Wt 130.0 lb

## 2022-12-11 DIAGNOSIS — Z9889 Other specified postprocedural states: Secondary | ICD-10-CM

## 2022-12-11 DIAGNOSIS — Z01812 Encounter for preprocedural laboratory examination: Secondary | ICD-10-CM

## 2022-12-11 HISTORY — DX: Diffuse cystic mastopathy of unspecified breast: N60.19

## 2022-12-11 HISTORY — DX: Urinary tract infection, site not specified: N39.0

## 2022-12-11 HISTORY — DX: Personal history of urinary calculi: Z87.442

## 2022-12-11 HISTORY — DX: Deficiency of other specified B group vitamins: E53.8

## 2022-12-11 HISTORY — DX: Unspecified ovarian cyst, left side: N83.202

## 2022-12-11 HISTORY — DX: Endometriosis, unspecified: N80.9

## 2022-12-11 HISTORY — DX: Heart failure, unspecified: I50.9

## 2022-12-11 NOTE — Patient Instructions (Addendum)
Your procedure is scheduled on: Wednesday, December 27 Report to the Registration Desk on the 1st floor of the [facility]. To find out your arrival time, please call [phone] between 1PM - 3PM on: Tuesday, December 26 If your arrival time is 6:00 am, do not arrive prior to that time as the [facility] entrance doors do not open until 6:00 am.  REMEMBER: Instructions that are not followed completely may result in serious medical risk, up to and including death; or upon the discretion of your surgeon and anesthesiologist your surgery may need to be rescheduled.  Do not eat food after midnight the night before surgery.  No gum chewing, lozengers or hard candies.  You may however, drink CLEAR liquids up to 2 hours before you are scheduled to arrive for your surgery. Do not drink anything within 2 hours of your scheduled arrival time.  Clear liquids include: - water  - apple juice without pulp - gatorade (not RED colors) - black coffee or tea (Do NOT add milk or creamers to the coffee or tea) Do NOT drink anything that is not on this list.  TAKE THESE MEDICATIONS THE MORNING OF SURGERY WITH A SIP OF WATER:  Escitalopram (Lexapro) Sertraline (Zoloft)  One week prior to surgery: starting December 20 Stop aspirin and Anti-inflammatories (NSAIDS) such as Advil, Aleve, Ibuprofen, Motrin, Naproxen, Naprosyn and Aspirin based products such as Excedrin, Goodys Powder, BC Powder. Stop ANY OVER THE COUNTER supplements until after surgery. You may however, continue to take Tylenol if needed for pain up until the day of surgery.  No Alcohol for 24 hours before or after surgery.  No Smoking including e-cigarettes for 24 hours prior to surgery.  No chewable tobacco products for at least 6 hours prior to surgery.  No nicotine patches on the day of surgery.  Do not use any "recreational" drugs for at least a week prior to your surgery.  Please be advised that the combination of cocaine and  anesthesia may have negative outcomes, up to and including death. If you test positive for cocaine, your surgery will be cancelled.  On the morning of surgery brush your teeth with toothpaste and water, you may rinse your mouth with mouthwash if you wish. Do not swallow any toothpaste or mouthwash.  Use CHG Soap as directed on instruction sheet.  Do not wear jewelry, make-up, hairpins, clips or nail polish.  Do not wear lotions, powders, or perfumes.   Do not shave body from the neck down 48 hours prior to surgery just in case you cut yourself which could leave a site for infection.  Also, freshly shaved skin may become irritated if using the CHG soap.  Contact lenses, hearing aids and dentures may not be worn into surgery.  Do not bring valuables to the hospital. [facility] is not responsible for any missing/lost belongings or valuables.   Notify your doctor if there is any change in your medical condition (cold, fever, infection).  Wear comfortable clothing (specific to your surgery type) to the hospital.  After surgery, you can help prevent lung complications by doing breathing exercises.  Take deep breaths and cough every 1-2 hours. Your doctor may order a device called an Incentive Spirometer to help you take deep breaths. When coughing or sneezing, hold a pillow firmly against your incision with both hands. This is called "splinting." Doing this helps protect your incision. It also decreases belly discomfort.  If you are being discharged the day of surgery, you  will not be allowed to drive home. You will need a responsible adult (18 years or older) to drive you home and stay with you that night.   If you are taking public transportation, you will need to have a responsible adult (18 years or older) with you. Please confirm with your physician that it is acceptable to use public transportation.   Please call the [facility] at [phone] if you have any  questions about these instructions.  Surgery Visitation Policy:  Patients undergoing a surgery or procedure may have two family members or support persons with them as long as the person is not COVID-19 positive or experiencing its symptoms.      Preparing for Surgery with CHLORHEXIDINE GLUCONATE (CHG) Soap  Chlorhexidine Gluconate (CHG) Soap  o An antiseptic cleaner that kills germs and bonds with the skin to continue killing germs even after washing  o Used for showering the night before surgery and morning of surgery  Before surgery, you can play an important role by reducing the number of germs on your skin.  CHG (Chlorhexidine gluconate) soap is an antiseptic cleanser which kills germs and bonds with the skin to continue killing germs even after washing.  Please do not use if you have an allergy to CHG or antibacterial soaps. If your skin becomes reddened/irritated stop using the CHG.  1. Shower the NIGHT BEFORE SURGERY and the MORNING OF SURGERY with CHG soap.  2. If you choose to wash your hair, wash your hair first as usual with your normal shampoo.  3. After shampooing, rinse your hair and body thoroughly to remove the shampoo.  4. Use CHG as you would any other liquid soap. You can apply CHG directly to the skin and wash gently with a scrungie or a clean washcloth.  5. Apply the CHG soap to your body only from the neck down. Do not use on open wounds or open sores. Avoid contact with your eyes, ears, mouth, and genitals (private parts). Wash face and genitals (private parts) with your normal soap.  6. Wash thoroughly, paying special attention to the area where your surgery will be performed.  7. Thoroughly rinse your body with warm water.  8. Do not shower/wash with your normal soap after using and rinsing off the CHG soap.  9. Pat yourself dry with a clean towel.  10. Wear clean pajamas to bed the night before surgery.  12. Place clean sheets on your bed the night  of your first shower and do not sleep with pets.  13. Shower again with the CHG soap on the day of surgery prior to arriving at the hospital.  14. Do not apply any deodorants/lotions/powders.  15. Please wear clean clothes to the hospital.

## 2022-12-12 ENCOUNTER — Encounter: Payer: Self-pay | Admitting: Obstetrics and Gynecology

## 2022-12-12 NOTE — Progress Notes (Signed)
Perioperative Services  Pre-Admission/Anesthesia Testing Clinical Review  Date: 12/14/22  Patient Demographics:  Name: [name] DOB:   1975-12-28 MRN:   [id number]  Planned Surgical Procedure(s):    Case: [id number] Date/Time: 12/19/22 0715   Procedures:      XI ROBOTIC ASSISTED LAPAROSCOPIC LEFT OVARIAN CYSTECTOMY (Left)     LYSIS OF ADHESION     LAPAROSCOPIC LEFT OOPHORECTOMY WITH PELVIC WASHINGS (Left)   Anesthesia type: Choice   Pre-op diagnosis: left adnexal mass, left pelvic pain   Location: [facility] OR ROOM 07 / [facility]   Surgeons: Benjaman Kindler, MD   NOTE: Available PAT nursing documentation and vital signs have been reviewed. Clinical nursing staff has updated patient's PMH/PSHx, current medication list, and drug allergies/intolerances to ensure comprehensive history available to assist in medical decision making as it pertains to the aforementioned surgical procedure and anticipated anesthetic course. Extensive review of available clinical information performed. [place] PMH and PSHx updated with any diagnoses/procedures that  may have been inadvertently omitted during her intake with the pre-admission testing department's nursing staff.  Clinical Discussion:  Virginia Rodriguez is a 46 y.o. female who is submitted for pre-surgical anesthesia review and clearance prior to her undergoing the above procedure. Patient has never been a smoker. Pertinent PMH includes: CHF, MVP (s/p MV repair), TIA, fatigue, menorrhagia, ovarian cyst, depression.  Patient is followed by cardiology Virginia Brochure, MD). She was last seen in the cardiology clinic on 04/19/2022; notes reviewed.  At the time of her clinic visit, patient reported that she had been doing "very well over the last year".  Patient denied any episodes of chest pain, shortness breath, PND, orthopnea, palpitations, significant peripheral edema, vertiginous symptoms, or presyncope/syncope.  Patient with a past  medical history significant for cardiovascular diagnoses.  Diagnostic LEFT heart catheterization was performed on 08/27/2008 revealing a normal left ventricular systolic function with an EF of 60%.  Coronary anatomy was normal with no evidence of obstructive coronary artery disease.  Patient with a history of prolapse of the posterior leaflet of her mitral valve resulting in severe valvular regurgitation.  She ultimately underwent ring annuloplasty via a RIGHT thoracotomy approach on 07/14/2013.  Patient had a 36 mm Simulus ring placed.  Stress echocardiogram was performed on 04/13/2021 revealing a normal LVEF of >55%.  There were no wall motion abnormalities at rest or with stress.  There was an appropriate blood pressure response to exercise.  No significant echocardiographic or ECG changes during the study.  Maximum workload achieved was 11.20 METS with exercise.  There was trivial tricuspid and mild mitral regurgitation.  Study determined to be normal.  Blood pressure well-controlled at 111/70 mmHg without the use of pharmacological intervention.  Patient is not on any type of lipid-lowering therapies for ASCVD prevention.  She is not diabetic. Patient does not have an OSAH diagnosis.  Patient does not have any activity limitations. Functional capacity, as defined by DASI, is documented as being >/= 4 METS.  No changes were made to her medication regimen.  Patient to follow-up with outpatient cardiology in 1 year or sooner if needed.  Virginia Rodriguez is scheduled for an XI ROBOTIC ASSISTED LAPAROSCOPIC LEFT OVARIAN CYSTECTOMY; LYSIS OF ADHESIONS; LAPAROSCOPIC LEFT OOPHORECTOMY WITH PELVIC WASHINGS on 12/19/2022 with Dr. Benjaman Kindler, MD.  Given patient's past medical history significant for cardiovascular diagnoses, presurgical cardiac clearance was sought by the PAT team. Per cardiology, "patient is clinically stable. She is at LOW risk for cardiovascular events  surrounding her upcoming surgery.  May proceed without need for further testing".  In review of her medication reconciliation, it is noted that patient is currently on prescribed daily antiplatelet therapy. She has been instructed on recommendations from cardiology for continuing her daily low dose ASA throughout her perioperative course.   Patient reports previous perioperative complications with anesthesia in the past. Patient has a PMH (+) for PONV. Symptoms and history of PONV will be discussed with patient by anesthesia team on the day of her procedure. Interventions will be ordered as deemed necessary based on patient's individual care needs as determined by anesthesiologist.  Additionally, patient has a history of (+) delayed emergence from general anesthesia.  In review of the available records, it is noted that patient underwent a general anesthetic course at [facility] in 06/2013 without documented complications.      12/11/2022   12:51 PM 10/11/2022    6:38 PM 10/11/2022    6:35 PM  Vitals with BMI  Height '5\' 8"'$   '5\' 8"'$   Weight 130 lbs  130 lbs  BMI 38.25  05.39  Systolic  767   Diastolic  55   Pulse  67     Providers/Specialists:   NOTE: Primary physician provider listed below. Patient may have been seen by APP or partner within same practice.   PROVIDER ROLE / SPECIALTY LAST Virginia Jansky, MD OB/GYN (Surgeon) 12/04/2022  Idelle Crouch, MD Primary Care Provider 08/02/2022  Jenne Pane, MD Cardiology 04/19/2022   Allergies:  Banana and Macrobid [nitrofurantoin macrocrystal]  Current Home Medications:   No current facility-administered medications for this encounter.    amoxicillin (AMOXIL) 500 MG capsule   aspirin EC 81 MG tablet   cyanocobalamin (VITAMIN B12) 1000 MCG/ML injection   escitalopram (LEXAPRO) 20 MG tablet   sertraline (ZOLOFT) 50 MG tablet   History:   Past Medical History:  Diagnosis Date   Anxiety    B12 deficiency    CHF (congestive  heart failure), NYHA class II (HCC)    Complication of anesthesia    a.) PONV; b.) delayed emergence   Depression    Diffuse cystic mastopathy    Endometriosis    Fatigue    Fibrocystic breast disease    Frequent UTI    Genital herpes    History of cardiac catheterization 08/27/2008   a.) LHC 08/27/2008: EF 60%, no obstructive CAD   History of kidney stones    Left ovarian cyst    Menorrhagia    Mitral valve prolapse 01/23/2013   a.) stress TTE 01/23/2013: EF >55%, (+) MVP (post leaflet) with mod-sev MR; b.) TTE 06/26/2014L EF >55%, (+) MVP (post leaflet) with severe MR;  c.) Ring annuloplasty for MVR 07/14/2013 via RIGHT thoracotomy approach --> 36 mm Similus ring   Status post mitral valve repair 07/14/2013   a.) ring annuloplasty for MVR 07/14/2013 via RIGHT thoractomy approach --> 36 mm Simulus ring   TIA (transient ischemic attack) 2009   a.) presented as left sides weakness and paresthesias, left facial droop --> event self limiting with no residual effects   Past Surgical History:  Procedure Laterality Date   ABDOMINAL HYSTERECTOMY  02/2014   BREAST BIOPSY Right 10/07/2019   u/s bx, heart marker, BENIGN MAMMARY PARENCHYMA WITH FIBROCYSTIC CHANGES. - NEGATIVE FOR   BREAST BIOPSY Right 10/15/2019   stereo bx, coil marker and ribbon marker deployed in same area, path pending   BREAST EXCISIONAL BIOPSY Right 1995  benign   COLONOSCOPY  09/07/2022   laparosopic pelvic  2012   with right oophorectomy   LEFT HEART CATH AND CORONARY ANGIOGRAPHY Left 08/27/2008   Procdure: LEFT HEART CATH AND CORONARY ANGIOGRAPHY; Location: [place]; Surgeon: Katrine Coho, MD   MANDIBLE SURGERY  2001   MITRAL VALVE REPAIR  07/14/2013   Procedure: MITRAL VALVE RING ANNULOPLASTY; Location: [facility]; Surgeon: Lajuana Matte, MD   TUBAL LIGATION  2003   Family History  Problem Relation Age of Onset   Heart disease Mother    Diabetes Father    Heart disease Father    Breast cancer Maternal  Grandmother        great grandmother   Social History   Tobacco Use   Smoking status: Never   Smokeless tobacco: Never  Vaping Use   Vaping Use: Never used  Substance Use Topics   Alcohol use: No   Drug use: No    Pertinent Clinical Results:  LABS: Labs reviewed: Acceptable for surgery.  Hospital Outpatient Visit on 12/13/2022  Component Date Value Ref Range Status   WBC 12/13/2022 3.9 (L)  4.0 - 10.5 K/uL Final   RBC 12/13/2022 4.27  3.87 - 5.11 MIL/uL Final   Hemoglobin 12/13/2022 13.3  12.0 - 15.0 g/dL Final   HCT 12/13/2022 39.2  36.0 - 46.0 % Final   MCV 12/13/2022 91.8  80.0 - 100.0 fL Final   MCH 12/13/2022 31.1  26.0 - 34.0 pg Final   MCHC 12/13/2022 33.9  30.0 - 36.0 g/dL Final   RDW 12/13/2022 12.5  11.5 - 15.5 % Final   Platelets 12/13/2022 186  150 - 400 K/uL Final   nRBC 12/13/2022 0.0  0.0 - 0.2 % Final   Performed at [facility], [address] [place], [place] [address]   Sodium 12/13/2022 139  135 - 145 mmol/L Final   Potassium 12/13/2022 4.6  3.5 - 5.1 mmol/L Final   Chloride 12/13/2022 110  98 - 111 mmol/L Final   CO2 12/13/2022 22  22 - 32 mmol/L Final   Glucose, Bld 12/13/2022 95  70 - 99 mg/dL Final   Glucose reference range applies only to samples taken after fasting for at least 8 hours.   BUN 12/13/2022 22 (H)  6 - 20 mg/dL Final   Creatinine, Ser 12/13/2022 0.69  0.44 - 1.00 mg/dL Final   Calcium 12/13/2022 8.9  8.9 - 10.3 mg/dL Final   GFR, Estimated 12/13/2022 >60  >60 mL/min Final   Comment: (NOTE) Calculated using the CKD-EPI Creatinine Equation (2021)    Anion gap 12/13/2022 7  5 - 15 Final   Performed at [facility], [address] [place], [place] [address]   ABO/RH(D) 12/13/2022 A POS   Final   Antibody Screen 12/13/2022 NEG   Final   Sample Expiration 12/13/2022 12/27/2022,2359   Final   Extend sample reason 12/13/2022    Final                   Value:NO TRANSFUSIONS OR PREGNANCY IN THE PAST 3  MONTHS Performed at [facility], [address] [place], [place] [address]     ECG: Date: 12/13/2022 Time ECG obtained: 0831 AM Rate: 57 bpm Rhythm: sinus bradycardia Axis (leads I and aVF): Normal Intervals: PR 148 ms. QRS 82 ms. QTc 404 ms. ST segment and T wave changes: No evidence of acute ST segment elevation or depression Comparison: Similar to previous tracing obtained on 02/02/2021  IMAGING /  PROCEDURES: CT RENAL STONE STUDY performed on 03/19/2022 Borderline appearance for constipation Appendix poorly seen but no right lower quadrant inflammatory process is identified. Trace free pelvic fluid eccentric to the right. Simple appearing left ovarian cyst. No follow-up imaging of this finding is necessary. Punctate 1-2 mm right mid kidney nonobstructive renal calculus.  STRESS ECHOCARDIOGRAM performed on 04/13/2021 Normal left ventricular systolic function with an EF of >55% noted both at rest and with stress Moderate ST segment up sloping No regional wall motion abnormalities Mild MR and trivial TR Appropriate increase in SBP during stage 1-2 of exercise with 16 mmHg drop peak exercise in the setting of 6/10 dizziness.  No significant ECG or echocardiographic changes during this time. Symptoms resolved with rest. Maximum workload of 11.20 METS was achieved during exercise  TRANSTHORACIC ECHOCARDIOGRAM performed on 09/15/2018 Normal left ventricular systolic function with an EF of greater than 55%. No regional wall motion abnormalities Right ventricular size and function normal Prosthetic mitral valve ring noted with normal transvalvular pressures post MV repair Trivial AR and PR Mild MR and TR All transvalvular gradients normal; no valvular stenosis No pericardial effusion  Impression and Plan:  NAKESHA EBRAHIM has been referred for pre-anesthesia review and clearance prior to her undergoing the planned anesthetic and procedural courses. Available labs,  pertinent testing, and imaging results were personally reviewed by me. This patient has been appropriately cleared by cardiology with an overall LOW risk of significant perioperative cardiovascular complications.  Based on clinical review performed today (12/14/22), barring any significant acute changes in the patient's overall condition, it is anticipated that she will be able to proceed with the planned surgical intervention. Any acute changes in clinical condition may necessitate her procedure being postponed and/or cancelled. Patient will meet with anesthesia team (MD and/or CRNA) on the day of her procedure for preoperative evaluation/assessment. Questions regarding anesthetic course will be fielded at that time.   Pre-surgical instructions were reviewed with the patient during her PAT appointment and questions were fielded by PAT clinical staff. Patient was advised that if any questions or concerns arise prior to her procedure then she should return a call to PAT and/or her surgeon's office to discuss.  Honor Loh, MSN, APRN, FNP-C, CEN [facility]  Peri-operative Services Nurse Practitioner Phone: [phone] Fax: [phone] 12/14/22 8:28 AM  NOTE: This note has been prepared using Dragon dictation software. Despite my best ability to proofread, there is always the potential that unintentional transcriptional errors may still occur from this process.

## 2022-12-13 ENCOUNTER — Encounter: Payer: Self-pay | Admitting: Urgent Care

## 2022-12-13 ENCOUNTER — Encounter
Admission: RE | Admit: 2022-12-13 | Discharge: 2022-12-13 | Disposition: A | Discharge status: Home / Self Care | Payer: BC Managed Care – PPO | Source: Ambulatory Visit | Attending: Obstetrics and Gynecology | Admitting: Obstetrics and Gynecology

## 2022-12-13 DIAGNOSIS — Z01812 Encounter for preprocedural laboratory examination: Secondary | ICD-10-CM

## 2022-12-13 DIAGNOSIS — Z01818 Encounter for other preprocedural examination: Secondary | ICD-10-CM | POA: Diagnosis present

## 2022-12-13 DIAGNOSIS — Z9889 Other specified postprocedural states: Secondary | ICD-10-CM | POA: Diagnosis not present

## 2022-12-13 LAB — TYPE AND SCREEN
ABO/RH(D): A POS
Antibody Screen: NEGATIVE

## 2022-12-13 LAB — BASIC METABOLIC PANEL
Anion gap: 7 (ref 5–15)
BUN: 22 mg/dL — ABNORMAL HIGH (ref 6–20)
CO2: 22 mmol/L (ref 22–32)
Calcium: 8.9 mg/dL (ref 8.9–10.3)
Chloride: 110 mmol/L (ref 98–111)
Creatinine, Ser: 0.69 mg/dL (ref 0.44–1.00)
GFR, Estimated: >60 mL/min (ref >60)
Glucose, Bld: 95 mg/dL (ref 70–99)
Potassium: 4.6 mmol/L (ref 3.5–5.1)
Sodium: 139 mmol/L (ref 135–145)

## 2022-12-13 LAB — CBC
HCT: 39.2 % (ref 36.0–46.0)
Hemoglobin: 13.3 g/dL (ref 12.0–15.0)
MCH: 31.1 pg (ref 26.0–34.0)
MCHC: 33.9 g/dL (ref 30.0–36.0)
MCV: 91.8 fL (ref 80.0–100.0)
Platelets: 186 10*3/uL (ref 150–400)
RBC: 4.27 MIL/uL (ref 3.87–5.11)
RDW: 12.5 % (ref 11.5–15.5)
WBC: 3.9 10*3/uL — ABNORMAL LOW (ref 4.0–10.5)
nRBC: 0 % (ref 0.0–0.2)

## 2022-12-14 ENCOUNTER — Encounter: Payer: Self-pay | Admitting: Obstetrics and Gynecology

## 2022-12-19 ENCOUNTER — Other Ambulatory Visit: Payer: Self-pay

## 2022-12-19 ENCOUNTER — Ambulatory Visit
Admission: RE | Admit: 2022-12-19 | Discharge: 2022-12-19 | Disposition: A | Discharge status: Home / Self Care | Payer: BC Managed Care – PPO | Attending: Obstetrics and Gynecology | Admitting: Obstetrics and Gynecology

## 2022-12-19 ENCOUNTER — Encounter
Admission: RE | Disposition: A | Discharge status: Home / Self Care | Payer: Self-pay | Source: Home / Self Care | Attending: Obstetrics and Gynecology

## 2022-12-19 ENCOUNTER — Ambulatory Visit: Payer: BC Managed Care – PPO | Admitting: Urgent Care

## 2022-12-19 ENCOUNTER — Encounter: Payer: Self-pay | Admitting: Obstetrics and Gynecology

## 2022-12-19 DIAGNOSIS — F32A Depression, unspecified: Secondary | ICD-10-CM | POA: Diagnosis not present

## 2022-12-19 DIAGNOSIS — N736 Female pelvic peritoneal adhesions (postinfective): Secondary | ICD-10-CM | POA: Diagnosis not present

## 2022-12-19 DIAGNOSIS — F419 Anxiety disorder, unspecified: Secondary | ICD-10-CM | POA: Diagnosis not present

## 2022-12-19 DIAGNOSIS — I2510 Atherosclerotic heart disease of native coronary artery without angina pectoris: Secondary | ICD-10-CM | POA: Diagnosis not present

## 2022-12-19 DIAGNOSIS — R102 Pelvic and perineal pain: Secondary | ICD-10-CM | POA: Diagnosis not present

## 2022-12-19 DIAGNOSIS — I341 Nonrheumatic mitral (valve) prolapse: Secondary | ICD-10-CM | POA: Insufficient documentation

## 2022-12-19 DIAGNOSIS — I509 Heart failure, unspecified: Secondary | ICD-10-CM | POA: Diagnosis not present

## 2022-12-19 DIAGNOSIS — D271 Benign neoplasm of left ovary: Secondary | ICD-10-CM | POA: Diagnosis not present

## 2022-12-19 DIAGNOSIS — Z79899 Other long term (current) drug therapy: Secondary | ICD-10-CM | POA: Diagnosis not present

## 2022-12-19 DIAGNOSIS — Z01818 Encounter for other preprocedural examination: Secondary | ICD-10-CM

## 2022-12-19 HISTORY — DX: Depression, unspecified: F32.A

## 2022-12-19 HISTORY — DX: Excessive and frequent menstruation with regular cycle: N92.0

## 2022-12-19 HISTORY — DX: Other complications of anesthesia, initial encounter: T88.59XA

## 2022-12-19 HISTORY — DX: Herpesviral infection of urogenital system, unspecified: A60.00

## 2022-12-19 HISTORY — DX: Other fatigue: R53.83

## 2022-12-19 HISTORY — PX: ROBOTIC ASSISTED LAPAROSCOPIC OVARIAN CYSTECTOMY: SHX6081

## 2022-12-19 LAB — ABO/RH: ABO/RH(D): A POS

## 2022-12-19 SURGERY — EXCISION, CYST, OVARY, ROBOT-ASSISTED, LAPAROSCOPIC
Anesthesia: General | Laterality: Left

## 2022-12-19 MED ORDER — KETAMINE HCL 50 MG/5ML IJ SOSY
PREFILLED_SYRINGE | INTRAMUSCULAR | Status: AC
  Filled 2022-12-19: qty 5

## 2022-12-19 MED ORDER — BUPIVACAINE HCL (PF) 0.5 % IJ SOLN
INTRAMUSCULAR | Status: DC | PRN
  Administered 2022-12-19: 21 mL

## 2022-12-19 MED ORDER — MIDAZOLAM HCL 2 MG/2ML IJ SOLN
INTRAMUSCULAR | Status: AC
  Filled 2022-12-19: qty 2

## 2022-12-19 MED ORDER — LIDOCAINE HCL (CARDIAC) PF 100 MG/5ML IV SOSY
PREFILLED_SYRINGE | INTRAVENOUS | Status: DC | PRN
  Administered 2022-12-19: 100 mg via INTRAVENOUS

## 2022-12-19 MED ORDER — OXYCODONE HCL 5 MG/5ML PO SOLN: 5.0000 mg | Freq: Once | ORAL | Status: DC | PRN

## 2022-12-19 MED ORDER — GABAPENTIN 300 MG PO CAPS: 300.0000 mg | ORAL_CAPSULE | ORAL | Status: AC

## 2022-12-19 MED ORDER — EPHEDRINE SULFATE (PRESSORS) 50 MG/ML IJ SOLN
INTRAMUSCULAR | Status: DC | PRN
  Administered 2022-12-19: 10 mg via INTRAVENOUS

## 2022-12-19 MED ORDER — PROPOFOL 10 MG/ML IV BOLUS
INTRAVENOUS | Status: DC | PRN
  Administered 2022-12-19: 150 ug/kg/min via INTRAVENOUS
  Administered 2022-12-19: 200 mg via INTRAVENOUS

## 2022-12-19 MED ORDER — LIDOCAINE HCL (PF) 2 % IJ SOLN
INTRAMUSCULAR | Status: AC
  Filled 2022-12-19: qty 5

## 2022-12-19 MED ORDER — FAMOTIDINE 20 MG PO TABS: 20.0000 mg | ORAL_TABLET | Freq: Once | ORAL | Status: AC

## 2022-12-19 MED ORDER — OXYCODONE HCL 5 MG PO TABS: 5.0000 mg | ORAL_TABLET | ORAL | 0 refills | Status: AC | PRN

## 2022-12-19 MED ORDER — SCOPOLAMINE 1 MG/3DAYS TD PT72
1.0000 | MEDICATED_PATCH | Freq: Once | TRANSDERMAL | Status: DC
  Administered 2022-12-19: 1.5 mg via TRANSDERMAL

## 2022-12-19 MED ORDER — MIDAZOLAM HCL 2 MG/2ML IJ SOLN
INTRAMUSCULAR | Status: DC | PRN
  Administered 2022-12-19: 2 mg via INTRAVENOUS

## 2022-12-19 MED ORDER — DEXMEDETOMIDINE HCL IN NACL 80 MCG/20ML IV SOLN
INTRAVENOUS | Status: AC
  Filled 2022-12-19: qty 20

## 2022-12-19 MED ORDER — DROPERIDOL 2.5 MG/ML IJ SOLN: 0.6250 mg | Freq: Once | INTRAMUSCULAR | Status: AC | PRN

## 2022-12-19 MED ORDER — GABAPENTIN 300 MG PO CAPS
ORAL_CAPSULE | ORAL | Status: AC
  Administered 2022-12-19: 300 mg via ORAL
  Filled 2022-12-19: qty 1

## 2022-12-19 MED ORDER — HYDROMORPHONE HCL 1 MG/ML IJ SOLN
INTRAMUSCULAR | Status: AC
  Administered 2022-12-19: 0.5 mg via INTRAVENOUS
  Filled 2022-12-19: qty 1

## 2022-12-19 MED ORDER — ACETAMINOPHEN 500 MG PO TABS
ORAL_TABLET | ORAL | Status: AC
  Administered 2022-12-19: 1000 mg via ORAL
  Filled 2022-12-19: qty 2

## 2022-12-19 MED ORDER — CHLORHEXIDINE GLUCONATE 0.12 % MT SOLN: 15.0000 mL | Freq: Once | OROMUCOSAL | Status: AC

## 2022-12-19 MED ORDER — HYDROMORPHONE HCL 1 MG/ML IJ SOLN
0.2500 mg | INTRAMUSCULAR | Status: DC | PRN
  Administered 2022-12-19: 0.5 mg via INTRAVENOUS

## 2022-12-19 MED ORDER — FENTANYL CITRATE (PF) 100 MCG/2ML IJ SOLN
INTRAMUSCULAR | Status: AC
  Filled 2022-12-19: qty 2

## 2022-12-19 MED ORDER — ACETAMINOPHEN 500 MG PO TABS: 1000.0000 mg | ORAL_TABLET | ORAL | Status: AC

## 2022-12-19 MED ORDER — ROCURONIUM BROMIDE 10 MG/ML (PF) SYRINGE
PREFILLED_SYRINGE | INTRAVENOUS | Status: AC
  Filled 2022-12-19: qty 10

## 2022-12-19 MED ORDER — BUPIVACAINE HCL (PF) 0.5 % IJ SOLN
INTRAMUSCULAR | Status: AC
  Filled 2022-12-19: qty 30

## 2022-12-19 MED ORDER — OXYCODONE HCL 5 MG PO TABS: 5.0000 mg | ORAL_TABLET | Freq: Once | ORAL | Status: DC | PRN

## 2022-12-19 MED ORDER — FAMOTIDINE 20 MG PO TABS
ORAL_TABLET | ORAL | Status: AC
  Administered 2022-12-19: 20 mg via ORAL
  Filled 2022-12-19: qty 1

## 2022-12-19 MED ORDER — SUGAMMADEX SODIUM 500 MG/5ML IV SOLN
INTRAVENOUS | Status: AC
  Filled 2022-12-19: qty 5

## 2022-12-19 MED ORDER — EPHEDRINE 5 MG/ML INJ
INTRAVENOUS | Status: AC
  Filled 2022-12-19: qty 5

## 2022-12-19 MED ORDER — ORAL CARE MOUTH RINSE: 15.0000 mL | Freq: Once | OROMUCOSAL | Status: AC

## 2022-12-19 MED ORDER — DROPERIDOL 2.5 MG/ML IJ SOLN
INTRAMUSCULAR | Status: AC
  Administered 2022-12-19: 0.625 mg via INTRAVENOUS
  Filled 2022-12-19: qty 2

## 2022-12-19 MED ORDER — ROCURONIUM BROMIDE 100 MG/10ML IV SOLN
INTRAVENOUS | Status: DC | PRN
  Administered 2022-12-19 (×2): 30 mg via INTRAVENOUS

## 2022-12-19 MED ORDER — ACETAMINOPHEN EXTRA STRENGTH 500 MG PO TABS: 1000.0000 mg | ORAL_TABLET | Freq: Four times a day (QID) | ORAL | 0 refills | Status: AC

## 2022-12-19 MED ORDER — 0.9 % SODIUM CHLORIDE (POUR BTL) OPTIME
TOPICAL | Status: DC | PRN
  Administered 2022-12-19: 500 mL

## 2022-12-19 MED ORDER — IBUPROFEN 800 MG PO TABS: 800.0000 mg | ORAL_TABLET | Freq: Three times a day (TID) | ORAL | 1 refills | Status: AC

## 2022-12-19 MED ORDER — DEXMEDETOMIDINE HCL IN NACL 80 MCG/20ML IV SOLN
INTRAVENOUS | Status: DC | PRN
  Administered 2022-12-19: 4 ug via BUCCAL

## 2022-12-19 MED ORDER — PROPOFOL 1000 MG/100ML IV EMUL
INTRAVENOUS | Status: AC
  Filled 2022-12-19: qty 100

## 2022-12-19 MED ORDER — DOCUSATE SODIUM 100 MG PO CAPS: 100.0000 mg | ORAL_CAPSULE | Freq: Two times a day (BID) | ORAL | 0 refills | Status: AC

## 2022-12-19 MED ORDER — SCOPOLAMINE 1 MG/3DAYS TD PT72
MEDICATED_PATCH | TRANSDERMAL | Status: AC
  Filled 2022-12-19: qty 1

## 2022-12-19 MED ORDER — KETAMINE HCL 10 MG/ML IJ SOLN
INTRAMUSCULAR | Status: DC | PRN
  Administered 2022-12-19: 20 mg via INTRAVENOUS
  Administered 2022-12-19 (×2): 10 mg via INTRAVENOUS

## 2022-12-19 MED ORDER — LACTATED RINGERS IV SOLN: INTRAVENOUS | Status: DC

## 2022-12-19 MED ORDER — KETAMINE HCL 50 MG/ML IJ SOLN: INTRAMUSCULAR | Status: DC | PRN

## 2022-12-19 MED ORDER — ONDANSETRON HCL 4 MG/2ML IJ SOLN
INTRAMUSCULAR | Status: DC | PRN
  Administered 2022-12-19: 4 mg via INTRAVENOUS

## 2022-12-19 MED ORDER — DEXAMETHASONE SODIUM PHOSPHATE 10 MG/ML IJ SOLN
INTRAMUSCULAR | Status: DC | PRN
  Administered 2022-12-19: 15 mg via INTRAVENOUS

## 2022-12-19 MED ORDER — POVIDONE-IODINE 10 % EX SWAB
2.0000 | Freq: Once | CUTANEOUS | Status: AC
  Administered 2022-12-19: 2 via TOPICAL

## 2022-12-19 MED ORDER — CHLORHEXIDINE GLUCONATE 0.12 % MT SOLN
OROMUCOSAL | Status: AC
  Administered 2022-12-19: 15 mL via OROMUCOSAL
  Filled 2022-12-19: qty 15

## 2022-12-19 MED ORDER — FENTANYL CITRATE (PF) 100 MCG/2ML IJ SOLN
INTRAMUSCULAR | Status: DC | PRN
  Administered 2022-12-19 (×2): 25 ug via INTRAVENOUS

## 2022-12-19 MED ORDER — GABAPENTIN 600 MG PO TABS: 600.0000 mg | ORAL_TABLET | Freq: Every evening | ORAL | 3 refills | Status: AC | PRN

## 2022-12-19 SURGICAL SUPPLY — 94 items
APPLICATOR ARISTA FLEXITIP XL (MISCELLANEOUS) ×2 IMPLANT
BAG URINE DRAIN 2000ML AR STRL (UROLOGICAL SUPPLIES) ×2 IMPLANT
BLADE SURG SZ11 CARB STEEL (BLADE) ×2 IMPLANT
CANNULA CAP OBTURATR AIRSEAL 8 (CAP) ×2 IMPLANT
CANNULA REDUC XI 12-8 STAPL (CANNULA) ×2
CANNULA REDUCER 12-8 DVNC XI (CANNULA) ×1 IMPLANT
CATH FOLEY 2WAY  5CC 16FR (CATHETERS) ×2
CATH URTH 16FR FL 2W BLN LF (CATHETERS) ×2 IMPLANT
CHLORAPREP W/TINT 26 (MISCELLANEOUS) ×2 IMPLANT
CORD MONOPOLAR M/FML 12FT (MISCELLANEOUS) ×2 IMPLANT
COUNTER NEEDLE 20/40 LG (NEEDLE) ×2 IMPLANT
COVER TIP SHEARS 8 DVNC (MISCELLANEOUS) ×2 IMPLANT
COVER TIP SHEARS 8MM DA VINCI (MISCELLANEOUS) ×2
DERMABOND ADVANCED .7 DNX12 (GAUZE/BANDAGES/DRESSINGS) ×2 IMPLANT
DRAPE 3/4 80X56 (DRAPES) ×3 IMPLANT
DRAPE ARM DVNC X/XI (DISPOSABLE) ×8 IMPLANT
DRAPE COLUMN DVNC XI (DISPOSABLE) ×2 IMPLANT
DRAPE DA VINCI XI ARM (DISPOSABLE) ×8
DRAPE DA VINCI XI COLUMN (DISPOSABLE) ×2
DRAPE ROBOT W/ LEGGING 30X125 (DRAPES) ×2 IMPLANT
DRAPE STERI POUCH LG 24X46 STR (DRAPES) IMPLANT
ELECT REM PT RETURN 9FT ADLT (ELECTROSURGICAL) ×2
ELECTRODE REM PT RTRN 9FT ADLT (ELECTROSURGICAL) ×2 IMPLANT
GAUZE 4X4 16PLY ~~LOC~~+RFID DBL (SPONGE) ×2 IMPLANT
GLOVE BIO SURGEON STRL SZ7 (GLOVE) ×8 IMPLANT
GLOVE SURG SYN 7.0 (GLOVE) ×2 IMPLANT
GLOVE SURG SYN 7.0 PF PI (GLOVE) ×1 IMPLANT
GLOVE SURG SYN 8.0 (GLOVE) ×2 IMPLANT
GLOVE SURG SYN 8.0 PF PI (GLOVE) ×1 IMPLANT
GLOVE SURG UNDER LTX SZ7.5 (GLOVE) ×8 IMPLANT
GLOVE SURG UNDER POLY LF SZ7.5 (GLOVE) ×2 IMPLANT
GOWN STRL REUS W/ TWL LRG LVL3 (GOWN DISPOSABLE) ×14 IMPLANT
GOWN STRL REUS W/ TWL XL LVL3 (GOWN DISPOSABLE) ×2 IMPLANT
GOWN STRL REUS W/TWL LRG LVL3 (GOWN DISPOSABLE) ×12
GOWN STRL REUS W/TWL XL LVL3 (GOWN DISPOSABLE) ×2
GRASPER SUT TROCAR 14GX15 (MISCELLANEOUS) ×2 IMPLANT
HEMOSTAT ARISTA ABSORB 3G PWDR (HEMOSTASIS) IMPLANT
IRRIGATION STRYKERFLOW (MISCELLANEOUS) IMPLANT
IRRIGATOR STRYKERFLOW (MISCELLANEOUS)
IV NS 1000ML (IV SOLUTION)
IV NS 1000ML BAXH (IV SOLUTION) ×1 IMPLANT
KIT PINK PAD W/HEAD ARE REST (MISCELLANEOUS) ×2
KIT PINK PAD W/HEAD ARM REST (MISCELLANEOUS) ×2 IMPLANT
KIT TURNOVER CYSTO (KITS) ×2 IMPLANT
L-HOOK LAP DISP 36CM (ELECTROSURGICAL)
LABEL OR SOLS (LABEL) ×2 IMPLANT
LHOOK LAP DISP 36CM (ELECTROSURGICAL) IMPLANT
LIGASURE VESSEL 5MM BLUNT TIP (ELECTROSURGICAL) IMPLANT
MANIFOLD NEPTUNE II (INSTRUMENTS) ×2 IMPLANT
MANIPULATOR UTERINE 4.5 ZUMI (MISCELLANEOUS) IMPLANT
MANIPULATOR VCARE LG CRV RETR (MISCELLANEOUS) IMPLANT
MANIPULATOR VCARE SML CRV RETR (MISCELLANEOUS) IMPLANT
MANIPULATOR VCARE STD CRV RETR (MISCELLANEOUS) IMPLANT
NS IRRIG 1000ML POUR BTL (IV SOLUTION) ×1 IMPLANT
NS IRRIG 500ML POUR BTL (IV SOLUTION) ×2 IMPLANT
OBTURATOR OPTICAL STANDARD 8MM (TROCAR) ×2
OBTURATOR OPTICAL STND 8 DVNC (TROCAR) ×2
OBTURATOR OPTICALSTD 8 DVNC (TROCAR) ×2 IMPLANT
OCCLUDER COLPOPNEUMO (BALLOONS) ×1 IMPLANT
PACK GYN LAPAROSCOPIC (MISCELLANEOUS) ×2 IMPLANT
PAD OB MATERNITY 4.3X12.25 (PERSONAL CARE ITEMS) ×2 IMPLANT
PAD PREP 24X41 OB/GYN DISP (PERSONAL CARE ITEMS) ×2 IMPLANT
SCISSORS METZENBAUM CVD 33 (INSTRUMENTS) IMPLANT
SCRUB CHG 4% DYNA-HEX 4OZ (MISCELLANEOUS) ×2 IMPLANT
SEAL CANN UNIV 5-8 DVNC XI (MISCELLANEOUS) ×6 IMPLANT
SEAL XI 5MM-8MM UNIVERSAL (MISCELLANEOUS) ×6
SEALER VESSEL DA VINCI XI (MISCELLANEOUS)
SEALER VESSEL EXT DVNC XI (MISCELLANEOUS) ×1 IMPLANT
SET CYSTO W/LG BORE CLAMP LF (SET/KITS/TRAYS/PACK) IMPLANT
SET TUBE FILTERED XL AIRSEAL (SET/KITS/TRAYS/PACK) ×1 IMPLANT
SET TUBE SMOKE EVAC HIGH FLOW (TUBING) ×2 IMPLANT
SLEEVE Z-THREAD 5X100MM (TROCAR) ×1 IMPLANT
SOL PREP PVP 2OZ (MISCELLANEOUS) ×2
SOLUTION ELECTROLUBE (MISCELLANEOUS) ×1 IMPLANT
SOLUTION PREP PVP 2OZ (MISCELLANEOUS) ×2 IMPLANT
STAPLER CANNULA SEAL DVNC XI (STAPLE) ×1 IMPLANT
STAPLER CANNULA SEAL XI (STAPLE) ×2
STRIP CLOSURE SKIN 1/4X4 (GAUZE/BANDAGES/DRESSINGS) IMPLANT
SURGILUBE 2OZ TUBE FLIPTOP (MISCELLANEOUS) ×2 IMPLANT
SUT MNCRL 4-0 (SUTURE) ×2
SUT MNCRL 4-0 27XMFL (SUTURE) ×2
SUT MNCRL AB 4-0 PS2 18 (SUTURE) ×1 IMPLANT
SUT VIC AB 0 CT2 27 (SUTURE) ×3 IMPLANT
SUT VLOC 90 2/L VL 12 GS22 (SUTURE) ×1 IMPLANT
SUTURE MNCRL 4-0 27XMF (SUTURE) ×2 IMPLANT
SYR 50ML LL SCALE MARK (SYRINGE) ×1 IMPLANT
SYR 5ML LL (SYRINGE) IMPLANT
SYS BAG RETRIEVAL 10MM (BASKET)
SYSTEM BAG RETRIEVAL 10MM (BASKET) IMPLANT
TAPE TRANSPORE STRL 2 31045 (GAUZE/BANDAGES/DRESSINGS) ×1 IMPLANT
TRAP FLUID SMOKE EVACUATOR (MISCELLANEOUS) ×1 IMPLANT
TROCAR XCEL NON-BLD 5MMX100MML (ENDOMECHANICALS) ×2 IMPLANT
TUBING ART PRESS 48 MALE/FEM (TUBING) IMPLANT
WATER STERILE IRR 500ML POUR (IV SOLUTION) ×2 IMPLANT

## 2022-12-19 NOTE — Op Note (Signed)
Virginia Rodriguez PROCEDURE DATE: 12/19/2022  PREOPERATIVE DIAGNOSIS: Left sided pelvic pain, left complex ovarian cysts POSTOPERATIVE DIAGNOSIS: The same with minimal pelvic adhesions. PROCEDURE:  XI ROBOTIC ASSISTED LAPAROSCOPIC LEFT OVARIAN CYSTECTOMY: 92426 (CPT) LAPAROSCOPIC LEFT OOPHORECTOMY WITH PELVIC WASHINGS: 83419 (CPT)  SURGEON:  Dr. Benjaman Kindler, MD ASSISTANT: CST Anesthesiologist:  Anesthesiologist: Ilene Qua, MD CRNA: Candice Camp, CRNA; Jonna Clark, CRNA  INDICATIONS: 46 y.o. F here for definitive surgical management secondary to the indications listed under preoperative diagnoses; please see preoperative note for further details.   Complex adnexal mass on left with pain. Multiple prior pelvic surgeries.   Risks of surgery were discussed with the patient including but not limited to: bleeding which may require transfusion or reoperation; infection which may require antibiotics; injury to bowel, bladder, ureters or other surrounding organs; need for additional procedures; thromboembolic phenomenon, incisional problems and other postoperative/anesthesia complications. Written informed consent was obtained.    FINDINGS:   Pelvic: External genitalia negative for lesions. Vagina negative. Adnexa negative for masses or nodularity. Cervix without gross lesions. Uterus mobile, anteverted, small.   Intraoperative findings revealed a normal upper abdomen including bowel, diaphragmatic surfaces, stomach, and omentum. No evidence of endometriosis actively, but with small peritoneal scarring that was white and might have been from previous inclusions. The left ovary was multi-lobular, with a dark cyst and several firm nodular areas   ANESTHESIA:    General INTRAVENOUS FLUIDS:900  ml ESTIMATED BLOOD LOSS:10 ml URINE OUTPUT: 700 ml  SPECIMENS: Pelvic washings, left ovary and tube.  COMPLICATIONS: None immediate  PROCEDURE IN DETAIL: After informed consent was  obtained, the patient was taken to the operating room where general anesthesia was obtained without difficulty. The patient was positioned in the dorsal lithotomy position in [place] and her arms were carefully tucked at her sides and the usual precautions were taken. Deep Trendelenburg (20-25 deg) was established to confirm that she does not shift on the table.  She was prepped and draped in normal sterile fashion.  Time-out was performed and a Foley catheter was placed into the bladder. A uterine sound was steri-striped to the single toothed tenaculum at the cervix to use as uterine manipulation.  After infiltration of local anesthetic at the proposed trocar sites and confirmation of an OG tube, a 74m optiview was placed in the LUQ. Pneumoperitoneum was created to a pressure of 11 mmHg. The abdomen and pelvis was surveyed for the above findings.   A 12 mm mm incision was created infraumbilically, and a robotic 176mport was placed under direct visualization. The camera was placed and the abdomen surveyed, noting intact bowel below the site of entry. A survey of the pelvis and upper abdomen revealed the above findings. Right lateral 8-mm robotic port was placed under direct visualization and a 52m10meft port similarly placed.  The patient was placed in deepTrendelenburg and the bowel was displaced up into the upper abdomen. The robot was left side docked. The instruments were placed under direct visualization.   Pelvic washings taken.  The ureters were identified bilaterally coursing outside of the operative field.   Ovariolysis was performed and the left ovary was dissected medially with care to avoid the ureter.  The infundibulopelvic ligaments were skeletonized, sealed and divided, 3cm from the ovary itself.  The intraperitoneal pressure was dropped, and all planes of dissection, vascular pedicles  were found to be hemostatic.  The 49m20mrt fascia was closed with 2 figure of eights using 0  vicryl.  The robot was undocked. The lateral trocars were removed under visualization.  The CO2 gas was released and several deep breaths given to remove any remaining CO2 from the peritoneal cavity.  The skin incisions were closed with 4-0 Monocryl subcuticular stitch and dermabond. Sponge, lap and needle counts were correct x2.  The patient tolerated the procedure well and was taken to the recovery area awake and in stable condition. She received iv acetaminophen and Toradol prior to leaving the OR.   The patient will be discharged to home as per PACU criteria. Routine postoperative instructions given.  She was prescribed Percocet, Ibuprofen and Colace.  She will follow up in the clinic in two weeks for postoperative evaluation.   Anesthesia was reversed without difficulty.

## 2022-12-19 NOTE — Discharge Instructions (Addendum)
Laparoscopic Ovarian Surgery Discharge Instructions  For the next three days, take ibuprofen and acetaminophen on a schedule, every 8 hours. You can take them together or you can intersperse them, and take one every four hours. I also gave you gabapentin for nighttime, to help you sleep and also to control pain. Take gabapentin medicines at night for at least the next 3 nights. You also have a narcotic, oxycodone, to take as needed if the above medicines don't help.  Postop constipation is a major cause of pain. Stay well hydrated, walk as you tolerate, and take over the counter senna as well as stool softeners if you need them.   RISKS AND COMPLICATIONS  Infection. Bleeding. Injury to surrounding organs. Anesthetic side effects.   PROCEDURE  You may be given a medicine to help you relax (sedative) before the procedure. You will be given a medicine to make you sleep (general anesthetic) during the procedure. A tube will be put down your throat to help your breath while under general anesthesia. Several small cuts (incisions) are made in the lower abdominal area and one incision is made near the belly button. Your abdominal area will be inflated with a safe gas (carbon dioxide). This helps give the surgeon room to operate, visualize, and helps the surgeon avoid other organs. A thin, lighted tube (laparoscope) with a camera attached is inserted into your abdomen through the incision near the belly button. Other small instruments may also be inserted through other abdominal incisions. The ovary is located and are removed. After the ovary is removed, the gas is released from the abdomen. The incisions will be closed with stitches (sutures), and Dermabond. A bandage may be placed over the incisions.  AFTER THE PROCEDURE  You will also have some mild abdominal discomfort for 3-7 days. You will be given pain medicine to ease any discomfort. As long as there are no problems, you may be allowed to  go home. Someone will need to drive you home and be with you for at least 24 hours once home. You may have some mild discomfort in the throat. This is from the tube placed in your throat while you were sleeping. You may experience discomfort in the shoulder area from some trapped air between the liver and diaphragm. This sensation is normal and will slowly go away on its own.  HOME CARE INSTRUCTIONS  Take all medicines as directed. Only take over-the-counter or prescription medicines for pain, discomfort, or fever as directed by your caregiver. Resume daily activities as directed. Showers are preferred over baths for 2 weeks. You may resume sexual activities in 1 week or as you feel you would like to. Do not drive while taking narcotics.  SEEK MEDICAL CARE IF: . There is increasing abdominal pain. You feel lightheaded or faint. You have the chills. You have an oral temperature above 102 F (38.9 C). There is pus-like (purulent) drainage from any of the wounds. You are unable to pass gas or have a bowel movement. You feel sick to your stomach (nauseous) or throw up (vomit) and can't control it with your medicines.  MAKE SURE YOU:  Understand these instructions. Will watch your condition. Will get help right away if you are not doing well or get worse.  ExitCare Patient Information 2013 [place].    Laparoscopic Ovarian Surgery Discharge Instructions  For the next three days, take ibuprofen and acetaminophen on a schedule, every 8 hours. You can take them together or you can intersperse them,  and take one every four hours. I also gave you gabapentin for nighttime, to help you sleep and also to control pain. Take gabapentin medicines at night for at least the next 3 nights. You also have a narcotic, oxycodone, to take as needed if the above medicines don't help.  Postop constipation is a major cause of pain. Stay well hydrated, walk as you tolerate, and take over the counter  senna as well as stool softeners if you need them.   RISKS AND COMPLICATIONS  Infection. Bleeding. Injury to surrounding organs. Anesthetic side effects.   PROCEDURE  You may be given a medicine to help you relax (sedative) before the procedure. You will be given a medicine to make you sleep (general anesthetic) during the procedure. A tube will be put down your throat to help your breath while under general anesthesia. Several small cuts (incisions) are made in the lower abdominal area and one incision is made near the belly button. Your abdominal area will be inflated with a safe gas (carbon dioxide). This helps give the surgeon room to operate, visualize, and helps the surgeon avoid other organs. A thin, lighted tube (laparoscope) with a camera attached is inserted into your abdomen through the incision near the belly button. Other small instruments may also be inserted through other abdominal incisions. The ovary is located and are removed. After the ovary is removed, the gas is released from the abdomen. The incisions will be closed with stitches (sutures), and Dermabond. A bandage may be placed over the incisions.  AFTER THE PROCEDURE  You will also have some mild abdominal discomfort for 3-7 days. You will be given pain medicine to ease any discomfort. As long as there are no problems, you may be allowed to go home. Someone will need to drive you home and be with you for at least 24 hours once home. You may have some mild discomfort in the throat. This is from the tube placed in your throat while you were sleeping. You may experience discomfort in the shoulder area from some trapped air between the liver and diaphragm. This sensation is normal and will slowly go away on its own.  HOME CARE INSTRUCTIONS  Take all medicines as directed. Only take over-the-counter or prescription medicines for pain, discomfort, or fever as directed by your caregiver. Resume daily activities as  directed. Showers are preferred over baths for 2 weeks. You may resume sexual activities in 1 week or as you feel you would like to. Do not drive while taking narcotics.  SEEK MEDICAL CARE IF: . There is increasing abdominal pain. You feel lightheaded or faint. You have the chills. You have an oral temperature above 102 F (38.9 C). There is pus-like (purulent) drainage from any of the wounds. You are unable to pass gas or have a bowel movement. You feel sick to your stomach (nauseous) or throw up (vomit) and can't control it with your medicines.  MAKE SURE YOU:  Understand these instructions. Will watch your condition. Will get help right away if you are not doing well or get worse.  ExitCare Patient Information 2013 [place].    Here is a helpful article from the website [employer], regarding constipation  Here are reasons why constipation occurs after surgery: 1) During the operation and in the recovery room, most people are given opioid pain medication, primarily through an IV, to treat moderate or severe pain. Intravenous opioids include morphine, Dilaudid and fentanyl. After surgery, patients are often prescribed opioid pain  medication to take by mouth at home, including codeine, Vicodin, Norco, and Percocet. All of these medications cause constipation by slowing down the movement of your intestine. 2) Changes in your diet before surgery can be another culprit. It is common to get specific instructions to change how you normally eat or drink before your surgery, like only having liquids the day before or not having anything to eat or drink after midnight the night before surgery. For this reason, temporary dehydration may occur. This, along with not eating or only having liquids, means that you are getting less fiber than usual. Both these factors contribute to constipation. 3) Changes in your diet after surgery can also contribute to the problem. Although many people  don't have dietary restrictions after operations, being under anesthesia can make you lose your appetite for several hours and maybe even days. Some people can even have nausea or vomiting. Not eating or drinking normally means that you are not getting enough fiber and you can get dehydrated, both leading to constipation. 4) Lying in a bed more than usual--which happens before, during and after surgery--combined with the medications and diet changes, all work together to slow down your colon and make your poop turn to rock.  No one likes to be constipated.  Let's face it, it's not a pleasant feeling when you don't poop for days, then strain on the toilet to finally pass something large enough to cause damage. An ounce of prevention is worth a pound of cure, so: Assume you will be constipated. Plan and prepare accordingly. Post-surgery is one of those unique situations where the temporary use of laxatives can make a world of difference. Always consult with your doctor, and recognize that if you wait several days after surgery to take a laxative, the constipation might be too severe for these over-the-counter options. It is always important to discuss all medications you plan on taking with your doctor. Ask your doctor if you can start the laxative immediately after surgery. *  Here are go-to post-surgery laxatives: Senna: Senna is an herb that acts as a "stimulant laxative," meaning it increases the activity of the intestine to cause you to have a bowel movement. It comes in many forms, but senna pills are easy to take and are sold over the counter at almost all pharmacies. Since opioid pain medications slow down the activity of the intestine, it makes sense to take a medication to help reverse that side effect. Long-term use of a stimulant laxative is not a good idea since it can make your colon "lazy" and not function properly; however, temporary use immediately after surgery is acceptable. In general, if  you are able to eat a normal diet, taking senna soon after surgery works the best. Senna usually works within hours to produce a bowel movement, but this is less predictable when you are taking different medications after surgery. Try not to wait several days to start taking senna, as often it is too late by then. Just like with all medications or supplements, check with your doctor before starting new treatment.   Magnesium: Magnesium is an important mineral that our body needs. We get magnesium from some foods that we eat, especially foods that are high in fiber such as broccoli, almonds and whole grains. There are also magnesium-based medications used to treat constipation including milk of magnesia (magnesium hydroxide), magnesium citrate and magnesium oxide. They work by drawing water into the intestine, putting it into the class of "osmotic" laxatives.  Magnesium products in low doses appear to be safe, but if taken in very large doses, can lead to problems such as irregular heartbeat, low blood pressure and even death. It can also affect other medications you might be taking, therefore it is important to discuss using magnesium with your physician and pharmacist before initiating therapy. Most over-the-counter magnesium laxatives work very well to help with the constipation related to surgery, but sometimes they work too well and lead to diarrhea. Make sure you are somewhere with easy access to a bathroom, just in case.   Bisacodyl: Bisacodyl (generic name) is sold under brand names such as Dulcolax. Much like senna, it is a "stimulant laxative," meaning it makes your intestines move more quickly to push out the stool. This is another good choice to start taking as soon as your doctor says you can take a laxative after surgery. It comes in pill form and as a suppository, which is a good choice for people who cannot or are not allowed to swallow pills. Studies have shown that it works as a laxative, but  like most of these medications, you should use this on a short-term basis only.   Enema: Enemas strike fear in many people, but FEAR NOT! It's nowhere near as big a deal as you may think. An enema is just a way to get some liquid into your rectum by placing a specially designed device through your anus. If you have never done one, it might seem like a painful, unpleasant, uncomfortable, complicated and lengthy procedure. But in reality, it's simple, takes just a few seconds and is highly effective. The small ready-made bottles you buy at the pharmacy are much easier than the hose/large rubber container type. Those recommended positions illustrated in some instructions are generally not necessary to place the enema. It's very similar to the insertion of a tampon, requiring a slight squat. Some extra lubrication on the enema's tip (or on your anus) will make it a breeze. In certain cases, there is no substitute for a good enema. For example, if someone has not pooped for a few days, the beginning of the poop waiting to come out can become rock hard. Passing that hard stool can lead to much pain and problems like anal fissures. Inserting a little liquid to break up the rock-hard stool will help make its passage much easier. Enemas come with different liquids. Most come with saline, but there are also mineral oil options. You can also use warm water in the reusable enema containers. They all work. But since saline can sometimes be irritating, so try a mineral oil or water enema instead.  Here are commonly recommended constipation medications that do not work well for post-surgery constipation: Docusate: Docusate (generic name) most commonly referred to as Colace (brand name) is not really a laxative, but is classified as a stool softener. Although this medication is commonly prescribed, it is not recommended for several reasons: 1) there is no good medical evidence that it works 2) even if it has an effect, which  is very questionable, it is minimal and cannot combat the intestinal slowing caused by the opioid medications. Skip docusate to save money and space in your pillbox for something more effective.  PEG: Miralax (brand name) is basically a chemical called polyethylene glycol (PEG) and it has gained tremendous popularity as a laxative. This product is an "osmotic laxative" meaning it works by pulling water into the stool, making it softer. This is very similar to the  action of natural fiber in foods and supplements. Therefore, the effect seen by this medication is not immediate, causing a bowel movement in a day or more. Is this medication strong enough to battle the constipation related to having an operation? Maybe for some people not prone to constipation. But for most people, other laxatives are better to prevent constipation after surgery.Here is a helpful article from the website [employer], regarding constipation  Here are reasons why constipation occurs after surgery: 1) During the operation and in the recovery room, most people are given opioid pain medication, primarily through an IV, to treat moderate or severe pain. Intravenous opioids include morphine, Dilaudid and fentanyl. After surgery, patients are often prescribed opioid pain medication to take by mouth at home, including codeine, Vicodin, Norco, and Percocet. All of these medications cause constipation by slowing down the movement of your intestine. 2) Changes in your diet before surgery can be another culprit. It is common to get specific instructions to change how you normally eat or drink before your surgery, like only having liquids the day before or not having anything to eat or drink after midnight the night before surgery. For this reason, temporary dehydration may occur. This, along with not eating or only having liquids, means that you are getting less fiber than usual. Both these factors contribute to constipation. 3) Changes in  your diet after surgery can also contribute to the problem. Although many people don't have dietary restrictions after operations, being under anesthesia can make you lose your appetite for several hours and maybe even days. Some people can even have nausea or vomiting. Not eating or drinking normally means that you are not getting enough fiber and you can get dehydrated, both leading to constipation. 4) Lying in a bed more than usual--which happens before, during and after surgery--combined with the medications and diet changes, all work together to slow down your colon and make your poop turn to rock.  No one likes to be constipated.  Let's face it, it's not a pleasant feeling when you don't poop for days, then strain on the toilet to finally pass something large enough to cause damage. An ounce of prevention is worth a pound of cure, so: Assume you will be constipated. Plan and prepare accordingly. Post-surgery is one of those unique situations where the temporary use of laxatives can make a world of difference. Always consult with your doctor, and recognize that if you wait several days after surgery to take a laxative, the constipation might be too severe for these over-the-counter options. It is always important to discuss all medications you plan on taking with your doctor. Ask your doctor if you can start the laxative immediately after surgery. *  Here are go-to post-surgery laxatives: Senna: Senna is an herb that acts as a "stimulant laxative," meaning it increases the activity of the intestine to cause you to have a bowel movement. It comes in many forms, but senna pills are easy to take and are sold over the counter at almost all pharmacies. Since opioid pain medications slow down the activity of the intestine, it makes sense to take a medication to help reverse that side effect. Long-term use of a stimulant laxative is not a good idea since it can make your colon "lazy" and not function  properly; however, temporary use immediately after surgery is acceptable. In general, if you are able to eat a normal diet, taking senna soon after surgery works the best. Senna usually works within hours  to produce a bowel movement, but this is less predictable when you are taking different medications after surgery. Try not to wait several days to start taking senna, as often it is too late by then. Just like with all medications or supplements, check with your doctor before starting new treatment.   Magnesium: Magnesium is an important mineral that our body needs. We get magnesium from some foods that we eat, especially foods that are high in fiber such as broccoli, almonds and whole grains. There are also magnesium-based medications used to treat constipation including milk of magnesia (magnesium hydroxide), magnesium citrate and magnesium oxide. They work by drawing water into the intestine, putting it into the class of "osmotic" laxatives. Magnesium products in low doses appear to be safe, but if taken in very large doses, can lead to problems such as irregular heartbeat, low blood pressure and even death. It can also affect other medications you might be taking, therefore it is important to discuss using magnesium with your physician and pharmacist before initiating therapy. Most over-the-counter magnesium laxatives work very well to help with the constipation related to surgery, but sometimes they work too well and lead to diarrhea. Make sure you are somewhere with easy access to a bathroom, just in case.   Bisacodyl: Bisacodyl (generic name) is sold under brand names such as Dulcolax. Much like senna, it is a "stimulant laxative," meaning it makes your intestines move more quickly to push out the stool. This is another good choice to start taking as soon as your doctor says you can take a laxative after surgery. It comes in pill form and as a suppository, which is a good choice for people who cannot or  are not allowed to swallow pills. Studies have shown that it works as a laxative, but like most of these medications, you should use this on a short-term basis only.   Enema: Enemas strike fear in many people, but FEAR NOT! It's nowhere near as big a deal as you may think. An enema is just a way to get some liquid into your rectum by placing a specially designed device through your anus. If you have never done one, it might seem like a painful, unpleasant, uncomfortable, complicated and lengthy procedure. But in reality, it's simple, takes just a few seconds and is highly effective. The small ready-made bottles you buy at the pharmacy are much easier than the hose/large rubber container type. Those recommended positions illustrated in some instructions are generally not necessary to place the enema. It's very similar to the insertion of a tampon, requiring a slight squat. Some extra lubrication on the enema's tip (or on your anus) will make it a breeze. In certain cases, there is no substitute for a good enema. For example, if someone has not pooped for a few days, the beginning of the poop waiting to come out can become rock hard. Passing that hard stool can lead to much pain and problems like anal fissures. Inserting a little liquid to break up the rock-hard stool will help make its passage much easier. Enemas come with different liquids. Most come with saline, but there are also mineral oil options. You can also use warm water in the reusable enema containers. They all work. But since saline can sometimes be irritating, so try a mineral oil or water enema instead.  Here are commonly recommended constipation medications that do not work well for post-surgery constipation: Docusate: Docusate (generic name) most commonly referred to as Colace (brand  name) is not really a laxative, but is classified as a stool softener. Although this medication is commonly prescribed, it is not recommended for several reasons:  1) there is no good medical evidence that it works 2) even if it has an effect, which is very questionable, it is minimal and cannot combat the intestinal slowing caused by the opioid medications. Skip docusate to save money and space in your pillbox for something more effective.  PEG: Miralax (brand name) is basically a chemical called polyethylene glycol (PEG) and it has gained tremendous popularity as a laxative. This product is an "osmotic laxative" meaning it works by pulling water into the stool, making it softer. This is very similar to the action of natural fiber in foods and supplements. Therefore, the effect seen by this medication is not immediate, causing a bowel movement in a day or more. Is this medication strong enough to battle the constipation related to having an operation? Maybe for some people not prone to constipation. But for most people, other laxatives are better to prevent constipation after surgery        .AMBULATORY SURGERY  DISCHARGE INSTRUCTIONS   The drugs that you were given will stay in your system until tomorrow so for the next 24 hours you should not:  Drive an automobile Make any legal decisions Drink any alcoholic beverage   You may resume regular meals tomorrow.  Today it is better to start with liquids and gradually work up to solid foods.  You may eat anything you prefer, but it is better to start with liquids, then soup and crackers, and gradually work up to solid foods.   Please notify your doctor immediately if you have any unusual bleeding, trouble breathing, redness and pain at the surgery site, drainage, fever, or pain not relieved by medication.    Additional Instructions:        Please contact your physician with any problems or Same Day Surgery at [phone], Monday through Friday 6 am to 4 pm, or  at [facility] number at [phone].

## 2022-12-19 NOTE — Anesthesia Procedure Notes (Signed)
Procedure Name: Intubation Date/Time: 12/19/2022 8:00 AM  Performed by: Candice Camp, CRNAPre-anesthesia Checklist: Timeout performed, Patient being monitored, Suction available, Emergency Drugs available and Patient identified Patient Re-evaluated:Patient Re-evaluated prior to induction Oxygen Delivery Method: Circle system utilized Preoxygenation: Pre-oxygenation with 100% oxygen Induction Type: IV induction Ventilation: Mask ventilation without difficulty Laryngoscope Size: McGraph and 3 Grade View: Grade I Tube type: Oral Tube size: 7.0 mm Number of attempts: 1

## 2022-12-19 NOTE — Interval H&P Note (Signed)
History and Physical Interval Note:  12/19/2022 7:45 AM  Virginia Rodriguez  has presented today for surgery, with the diagnosis of left adnexal mass, left pelvic pain.  The various methods of treatment have been discussed with the patient and family. After consideration of risks, benefits and other options for treatment, the patient has consented to  Procedure(s): XI ROBOTIC ASSISTED LAPAROSCOPIC LEFT OVARIAN CYSTECTOMY (Left) LYSIS OF ADHESION (N/A) LAPAROSCOPIC LEFT OOPHORECTOMY WITH PELVIC WASHINGS (Left) as a surgical intervention.  The patient's history has been reviewed, patient examined, no change in status, stable for surgery.  I have reviewed the patient's chart and labs.  Questions were answered to the patient's satisfaction.     Benjaman Kindler

## 2022-12-19 NOTE — Anesthesia Preprocedure Evaluation (Signed)
Anesthesia Evaluation  Patient identified by MRN, date of birth, ID band Patient awake    Reviewed: Allergy & Precautions, NPO status , Patient's Chart, lab work & pertinent test results  History of Anesthesia Complications (+) PONV and history of anesthetic complications  Airway Mallampati: I  TM Distance: >3 FB Neck ROM: full    Dental no notable dental hx.    Pulmonary neg pulmonary ROS   Pulmonary exam normal        Cardiovascular +CHF  Normal cardiovascular exam+ Valvular Problems/Murmurs (s/p MVR) MVP      Neuro/Psych  PSYCHIATRIC DISORDERS Anxiety Depression    TIA   GI/Hepatic negative GI ROS, Neg liver ROS,,,  Endo/Other  negative endocrine ROS    Renal/GU      Musculoskeletal   Abdominal   Peds  Hematology negative hematology ROS (+)   Anesthesia Other Findings Past Medical History: No date: Anxiety No date: B12 deficiency No date: CHF (congestive heart failure), NYHA class II (HCC) No date: Complication of anesthesia     Comment:  a.) PONV; b.) delayed emergence No date: Depression 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 No date: Endometriosis No date: Fatigue No date: Fibrocystic breast disease No date: Frequent UTI No date: Genital herpes 08/27/2008: History of cardiac catheterization     Comment:  a.) LHC 08/27/2008: EF 60%, no obstructive CAD No date: History of kidney stones No date: Left ovarian cyst No date: Menorrhagia 01/23/2013: Mitral valve prolapse     Comment:  a.) stress TTE 01/23/2013: EF >55%, (+) MVP (post               leaflet) with mod-sev MR; b.) TTE 06/26/2014L EF >55%,               (+) MVP (post leaflet) with severe MR;  c.) Ring               annuloplasty for MVR 07/14/2013 via RIGHT thoracotomy               approach --> 36 mm Similus ring 07/14/2013: Status post mitral valve repair     Comment:  a.) ring annuloplasty for MVR 07/14/2013 via RIGHT                thoractomy approach --> 36 mm Simulus ring 2009: TIA (transient ischemic attack)     Comment:  a.) presented as left sides weakness and paresthesias,               left facial droop --> event self limiting with no               residual effects  Past Surgical History: 02/2014: ABDOMINAL HYSTERECTOMY 10/07/2019: BREAST BIOPSY; Right     Comment:  u/s bx, heart marker, BENIGN MAMMARY PARENCHYMA WITH               FIBROCYSTIC CHANGES. - NEGATIVE FOR 10/15/2019: BREAST BIOPSY; Right     Comment:  stereo bx, coil marker and ribbon marker deployed in               same area, path pending 1995: BREAST EXCISIONAL BIOPSY; Right     Comment:  benign 09/07/2022: COLONOSCOPY 2012: laparosopic pelvic     Comment:  with right oophorectomy 08/27/2008: LEFT HEART CATH AND CORONARY ANGIOGRAPHY; Left     Comment:  [facility]: LEFT HEART CATH AND CORONARY ANGIOGRAPHY;               Location: [place]; Surgeon: Phillips Grout  Clayborn Bigness, MD 2001: [place] SURGERY 07/14/2013: MITRAL VALVE REPAIR     Comment:  Procedure: MITRAL VALVE RING ANNULOPLASTY; Location:               [facility]; Surgeon: Lajuana Matte, MD 2003: TUBAL LIGATION  BMI    Body Mass Index: 19.77 kg/m      Reproductive/Obstetrics negative OB ROS                             Anesthesia Physical Anesthesia Plan  ASA: 3  Anesthesia Plan: General ETT   Post-op Pain Management: Toradol IV (intra-op)*, Ofirmev IV (intra-op)* and Dilaudid IV   Induction: Intravenous  PONV Risk Score and Plan: 4 or greater and Ondansetron, Dexamethasone, Midazolam, Treatment may vary due to age or medical condition and Scopolamine patch - Pre-op  Airway Management Planned: Oral ETT  Additional Equipment:   Intra-op Plan:   Post-operative Plan: Extubation in OR  Informed Consent: I have reviewed the patients History and Physical, chart, labs and discussed the procedure including the risks, benefits and alternatives for the proposed  anesthesia with the patient or authorized representative who has indicated his/her understanding and acceptance.     Dental Advisory Given  Plan Discussed with: Anesthesiologist, CRNA and Surgeon  Anesthesia Plan Comments: (Patient consented for risks of anesthesia including but not limited to:  - adverse reactions to medications - damage to eyes, teeth, lips or other oral mucosa - nerve damage due to positioning  - sore throat or hoarseness - Damage to heart, brain, nerves, lungs, other parts of body or loss of life  Patient voiced understanding.)        Anesthesia Quick Evaluation

## 2022-12-19 NOTE — Transfer of Care (Signed)
Immediate Anesthesia Transfer of Care Note  Patient: Virginia Rodriguez  Procedure(s) Performed: XI ROBOTIC ASSISTED LAPAROSCOPIC LEFT OVARIAN CYSTECTOMY (Left) LAPAROSCOPIC LEFT OOPHORECTOMY WITH PELVIC WASHINGS (Left)  Patient Location: PACU  Anesthesia Type:General  Level of Consciousness: awake  Airway & Oxygen Therapy: Patient Spontanous Breathing and Patient connected to nasal cannula oxygen  Post-op Assessment: Report given to RN, Post -op Vital signs reviewed and stable, and Patient moving all extremities  Post vital signs: Reviewed and stable  Last Vitals:  Vitals Value Taken Time  BP 108/73 12/19/22 1015  Temp 36 C 12/19/22 0945  Pulse 65 12/19/22 1019  Resp 22 12/19/22 1019  SpO2 100 % 12/19/22 1019  Vitals shown include unvalidated device data.  Last Pain:  Vitals:   12/19/22 1015  TempSrc:   PainSc: 4          Complications: No notable events documented.

## 2022-12-19 NOTE — Anesthesia Postprocedure Evaluation (Signed)
Anesthesia Post Note  Patient: Virginia Rodriguez  Procedure(s) Performed: XI ROBOTIC ASSISTED LAPAROSCOPIC LEFT OVARIAN CYSTECTOMY (Left) LAPAROSCOPIC LEFT OOPHORECTOMY WITH PELVIC WASHINGS (Left)  Patient location during evaluation: PACU Anesthesia Type: General Level of consciousness: awake and alert Pain management: pain level controlled Vital Signs Assessment: post-procedure vital signs reviewed and stable Respiratory status: spontaneous breathing, nonlabored ventilation, respiratory function stable and patient connected to nasal cannula oxygen Cardiovascular status: blood pressure returned to baseline and stable Postop Assessment: no apparent nausea or vomiting Anesthetic complications: no   No notable events documented.   Last Vitals:  Vitals:   12/19/22 1000 12/19/22 1015  BP: 118/75 108/73  Pulse: 67 68  Resp: 17 13  Temp:    SpO2: 100% 100%    Last Pain:  Vitals:   12/19/22 1015  TempSrc:   PainSc: 4                  Ilene Qua

## 2022-12-20 ENCOUNTER — Encounter: Payer: Self-pay | Admitting: Obstetrics and Gynecology

## 2022-12-20 LAB — SURGICAL PATHOLOGY

## 2022-12-20 LAB — CYTOLOGY - NON PAP

## 2024-01-16 IMAGING — CT CT RENAL STONE PROTOCOL
2 of 4 series · 16 of 46 positions shown, 18 images · non-contrast
Comparison: Multiple exams, including abdominal ultrasound
09/11/2016

CLINICAL DATA: Right lower quadrant abdominal pain



[Series 2: stone full standard · axial · 0.80mm/px · z∈[-881,-476]mm · 13 of 89 slices shown, 15 images]
[im 4/89  soft-tissue]
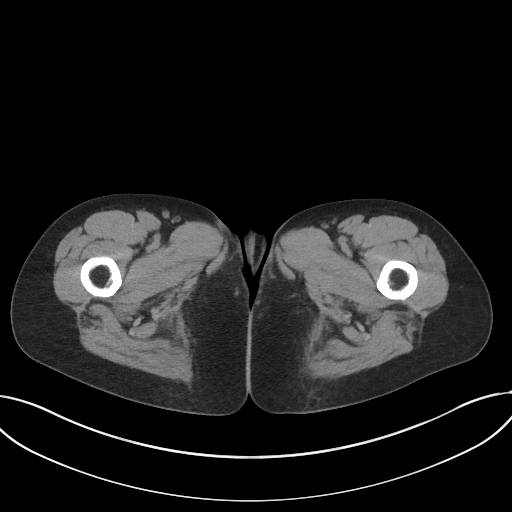
[im 4/89  bone]
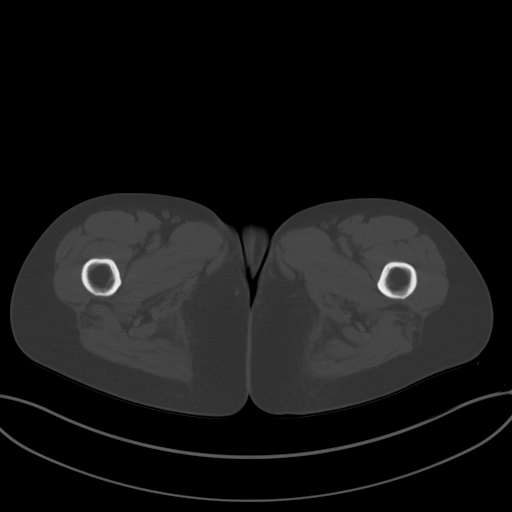
[im 12/89  soft-tissue]
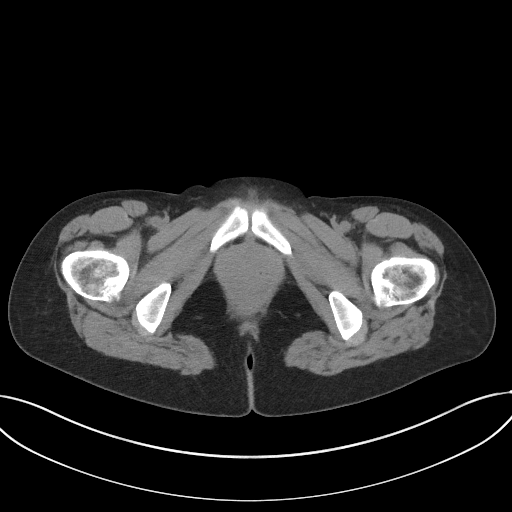
[im 19/89  soft-tissue]
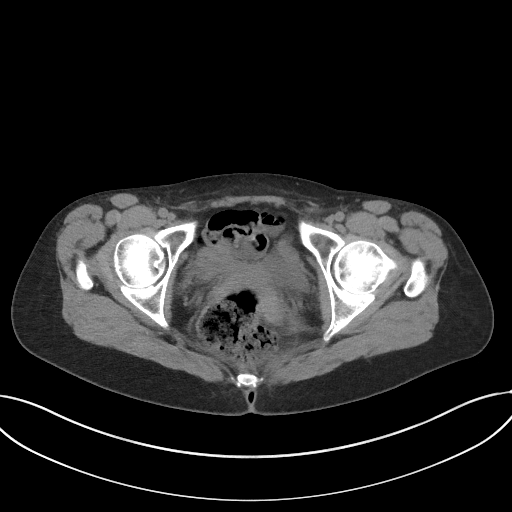
[im 26/89  soft-tissue]
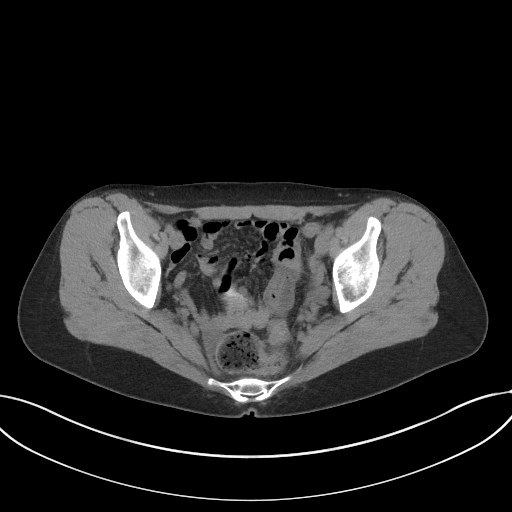
[im 30/89  soft-tissue]
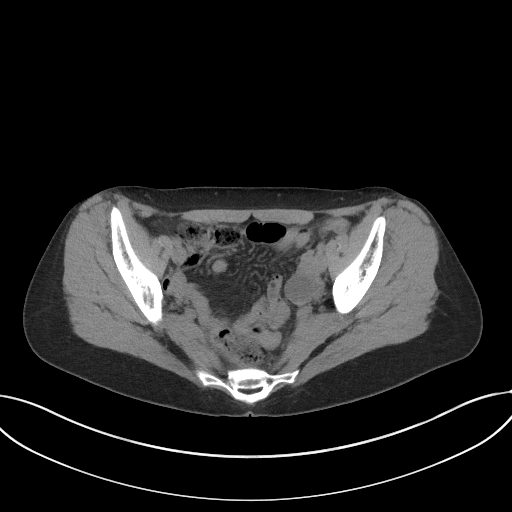
[im 37/89  soft-tissue]
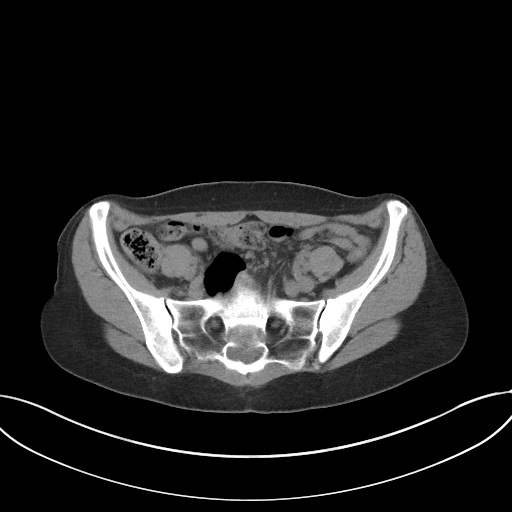
[im 45/89  soft-tissue]
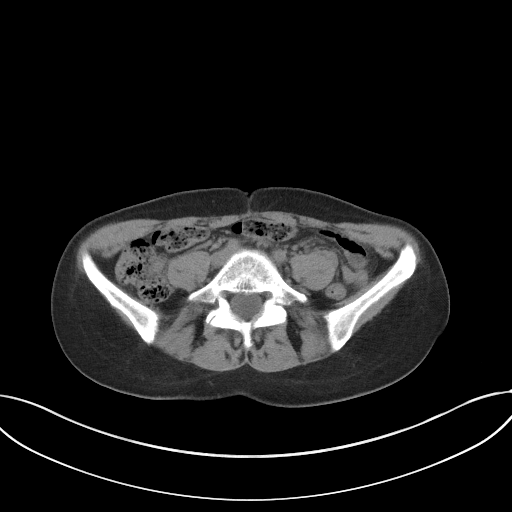
[im 52/89  soft-tissue]
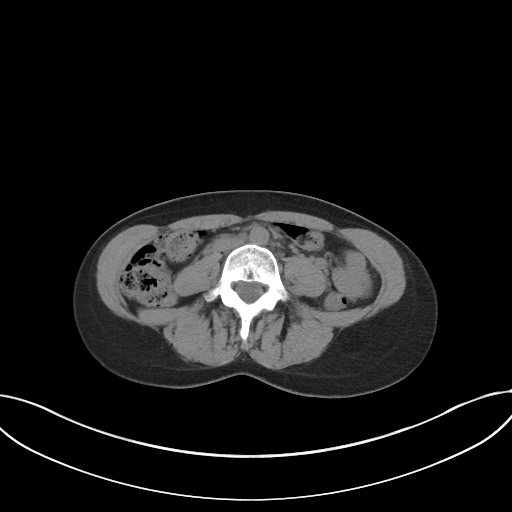
[im 59/89  soft-tissue]
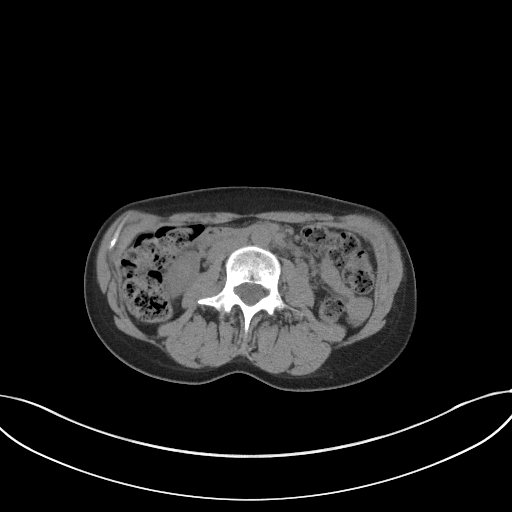
[im 59/89  bone]
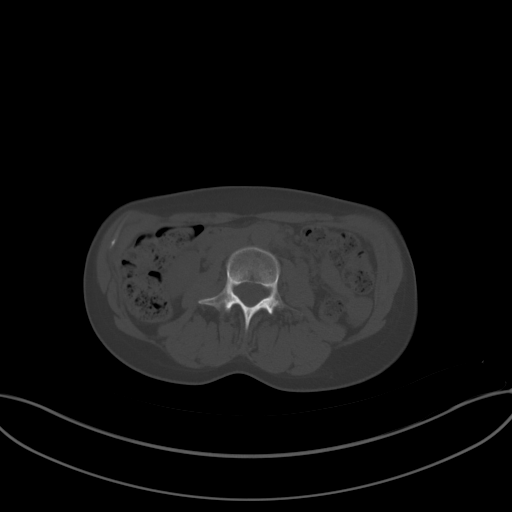
[im 63/89  soft-tissue]
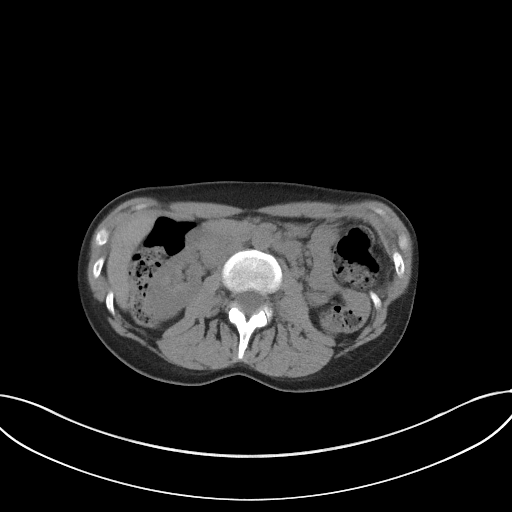
[im 70/89  soft-tissue]
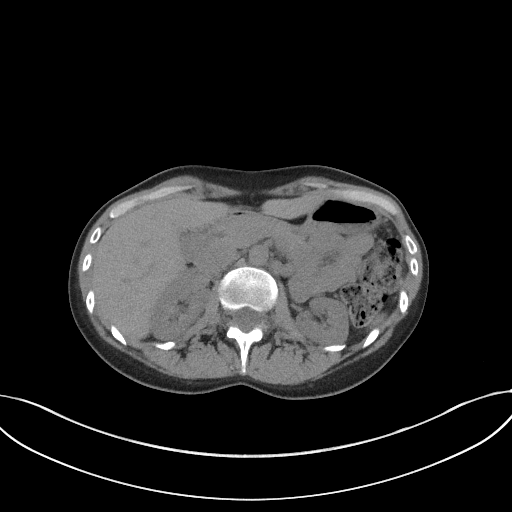
[im 78/89  soft-tissue]
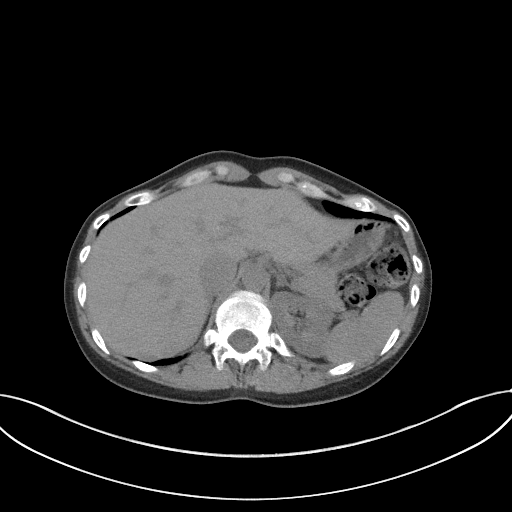
[im 85/89  soft-tissue]
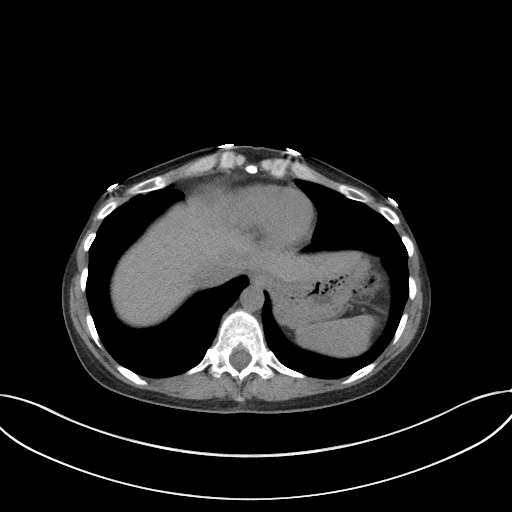

[Series 5: coronal · coronal · 0.77mm/px · 3 of 116 slices shown]
[im 39/116  soft-tissue]
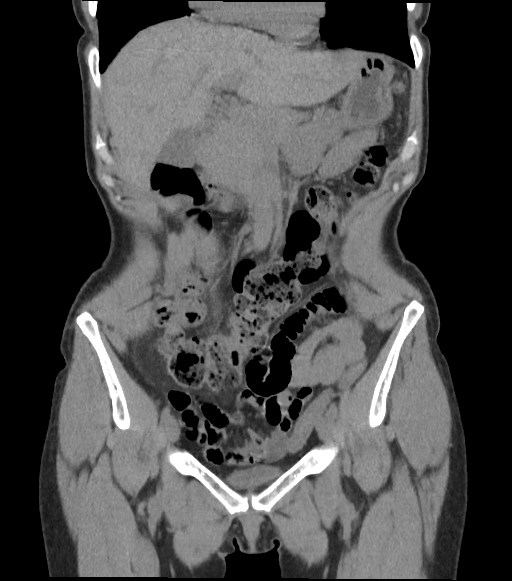
[im 52/116  soft-tissue]
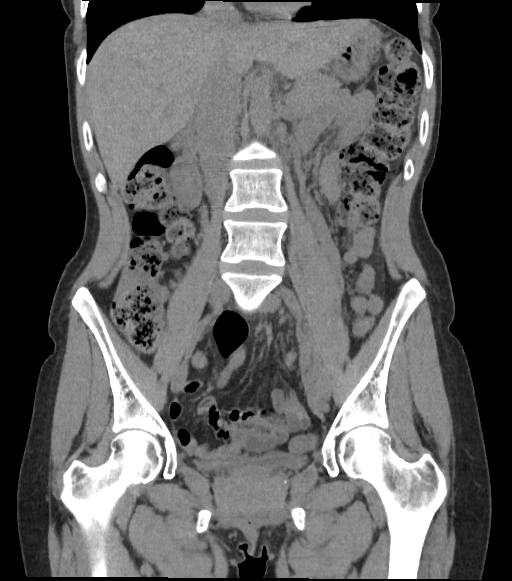
[im 64/116  soft-tissue]
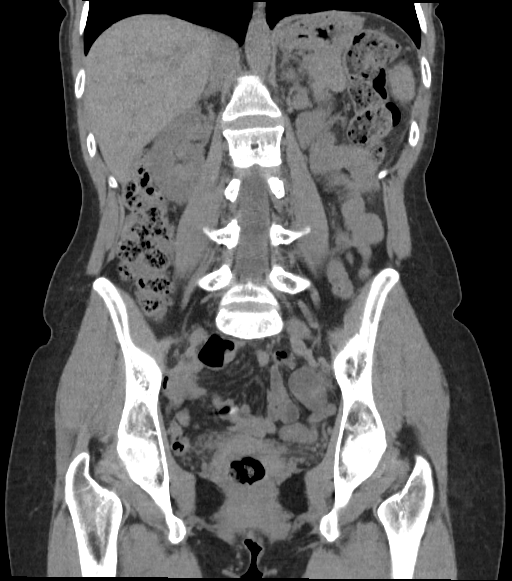

[16 of 46 positions shown; findings below may reference images not displayed]

FINDINGS: Lower chest: Mitral valve prosthesis visible on the scout image.
Mild scarring in the right middle lobe.

Hepatobiliary: Unremarkable

Pancreas: Unremarkable

Spleen: Unremarkable

Adrenals/Urinary Tract: Subtle renal medullary hyperattenuation
without overt medullary nephrocalcinosis. Suspected 1-2 mm punctate
nonobstructive right mid kidney calculus on image 60 series 5. No
hydronephrosis or hydroureter. Adrenal glands unremarkable.

Stomach/Bowel: Upper normal amount of stool in the colon. Appendix
poorly seen but no right lower quadrant inflammatory process is
identified.

Vascular/Lymphatic: Unremarkable

Reproductive: 2.1 by 2.0 cm simple appearing left ovarian cyst.
Uterus absent. Right ovary absent.

Other: Trace free pelvic fluid eccentric to the right, image 63
series 2.

Musculoskeletal: Unremarkable
IMPRESSION: 1. Borderline appearance for constipation.
2. Appendix poorly seen but no right lower quadrant inflammatory
process is identified.
3. Trace free pelvic fluid eccentric to the right.
4. Simple appearing left ovarian cyst. No follow-up imaging of this
finding is necessary. Reference: JACR [DATE]):[id number].
5. Punctate 1-2 mm right mid kidney nonobstructive renal calculus.
